# Patient Record
Sex: Female | Born: 1980 | Race: Black or African American | Hispanic: No | Smoking: Current every day smoker
Health system: Southern US, Community
[De-identification: ages and names within clinical notes are randomized; demographics above are authoritative.]

## PROBLEM LIST (undated history)

## (undated) DIAGNOSIS — G5701 Lesion of sciatic nerve, right lower limb: Secondary | ICD-10-CM

## (undated) DIAGNOSIS — J45909 Unspecified asthma, uncomplicated: Secondary | ICD-10-CM

## (undated) DIAGNOSIS — T7840XA Allergy, unspecified, initial encounter: Secondary | ICD-10-CM

## (undated) DIAGNOSIS — G479 Sleep disorder, unspecified: Secondary | ICD-10-CM

## (undated) DIAGNOSIS — F329 Major depressive disorder, single episode, unspecified: Secondary | ICD-10-CM

## (undated) DIAGNOSIS — F32A Depression, unspecified: Secondary | ICD-10-CM

## (undated) DIAGNOSIS — C539 Malignant neoplasm of cervix uteri, unspecified: Secondary | ICD-10-CM

## (undated) HISTORY — DX: Major depressive disorder, single episode, unspecified: F32.9

## (undated) HISTORY — DX: Allergy, unspecified, initial encounter: T78.40XA

## (undated) HISTORY — DX: Lesion of sciatic nerve, right lower limb: G57.01

## (undated) HISTORY — DX: Sleep disorder, unspecified: G47.9

## (undated) HISTORY — PX: NO PAST SURGERIES: SHX2092

## (undated) HISTORY — DX: Malignant neoplasm of cervix uteri, unspecified: C53.9

## (undated) HISTORY — DX: Unspecified asthma, uncomplicated: J45.909

## (undated) HISTORY — DX: Depression, unspecified: F32.A

---

## 2003-07-18 DIAGNOSIS — C539 Malignant neoplasm of cervix uteri, unspecified: Secondary | ICD-10-CM

## 2003-07-18 HISTORY — DX: Malignant neoplasm of cervix uteri, unspecified: C53.9

## 2017-04-26 ENCOUNTER — Encounter: Payer: Self-pay | Admitting: Family Medicine

## 2017-04-26 ENCOUNTER — Ambulatory Visit (INDEPENDENT_AMBULATORY_CARE_PROVIDER_SITE_OTHER): Payer: Medicaid Other | Admitting: Family Medicine

## 2017-04-26 VITALS — BP 127/89 | HR 81 | Temp 97.6°F | Ht 65.5 in | Wt 194.2 lb

## 2017-04-26 DIAGNOSIS — M5431 Sciatica, right side: Secondary | ICD-10-CM | POA: Diagnosis not present

## 2017-04-26 DIAGNOSIS — R2689 Other abnormalities of gait and mobility: Secondary | ICD-10-CM | POA: Diagnosis not present

## 2017-04-26 DIAGNOSIS — E669 Obesity, unspecified: Secondary | ICD-10-CM | POA: Diagnosis not present

## 2017-04-26 DIAGNOSIS — Z820 Family history of epilepsy and other diseases of the nervous system: Secondary | ICD-10-CM | POA: Diagnosis not present

## 2017-04-26 NOTE — Progress Notes (Signed)
   HPI  Patient presents today here to establish care.  Patient has moved from [place] about one year ago, since that time she has not seen a physician.  Patient states that she's concerned that she is developing multiple sclerosis. Her mother died at the age of 60 with breast cancer, she was diagnosed with multiple sclerosis around 36 years old. Patient states that she has multiple symptoms consistent with multiple sclerosis including driving her feet, bilateral leg and normal pain, difficulty with balance at times, blurry vision, and occasional double vision.  Patient also complains of right-sided low back pain radiating down to the right popliteal fossa. She states this has been going on for more than 10 years. She denies history of trauma or injury to the back. She does not even try Tylenol for the pain.  PMH: Asthma, allergies, cervical cancer 2005, depression Surgical history: None Family history Alzheimer's in [facility], breast and brain cancer in mother, mother also with migraines, hyperlipidemia, depression, dementia. Father with hyperlipidemia and hypertension. Social history: Current smoker- denies drug and alcohol use ROS: Per HPI  Objective: BP 127/89   Pulse 81   Temp 97.6 F (36.4 C) (Oral)   Ht 5' 5.5" (1.664 m)   Wt 194 lb 3.2 oz (88.1 kg)   LMP 04/17/2017 (Approximate)   BMI 31.83 kg/m  Gen: NAD, alert, cooperative with exam Unusual presentation, it is currently raining very heavily outside and patient and her sister present soaking wet, they exaplined they have walked from home. Poor grooming HEENT: NCAT CV: RRR, good S1/S2, no murmur Resp: CTABL, no wheezes, non-labored Ext: No edema, warm Neuro: Alert and oriented, No gross deficits  Assessment and plan:  # Obesity Discussed with patient, discussed therapeutic lifestyle changes Labs, nonfasting  # Sciatica of the right side Plain film today, patient states that she'll return for this. Discussed  Tylenol for pain  # Family history of MS, balance problems Patient has multiple symptoms she states her consistent with multiple sclerosis, her exam is reassuring. Refer to neurology for thorough evaluation   Orders Placed This Encounter  Procedures  . DG Lumbar Spine 2-3 Views    Standing Status:   Future    Standing Expiration Date:   06/26/2018    Order Specific Question:   Reason for Exam (SYMPTOM  OR DIAGNOSIS REQUIRED)    Answer:   low back with scaitica R side    Order Specific Question:   Is the patient pregnant?    Answer:   No    Order Specific Question:   Preferred imaging location?    Answer:   Internal  . CMP14+EGFR  . CBC with Differential/Platelet  . Lipid panel  . TSH  . Ambulatory referral to Neurology    Referral Priority:   Routine    Referral Type:   Consultation    Referral Reason:   Specialty Services Required    Requested Specialty:   Neurology    Number of Visits Requested:   [place], MD [facility] 04/26/2017, 1:50 PM

## 2017-04-26 NOTE — Patient Instructions (Signed)
Great to meet you!  Come back in 3-4 months unless you need [place] sooner.   We will get the ball rolling on a neurology referral

## 2017-04-27 ENCOUNTER — Other Ambulatory Visit: Payer: Self-pay

## 2017-04-27 DIAGNOSIS — R7989 Other specified abnormal findings of blood chemistry: Secondary | ICD-10-CM

## 2017-04-27 LAB — CMP14+EGFR
A/G RATIO: 2 (ref 1.2–2.2)
ALK PHOS: 59 IU/L (ref 39–117)
ALT: 21 IU/L (ref 0–32)
AST: 23 IU/L (ref 0–40)
Albumin: 4.3 g/dL (ref 3.5–5.5)
BUN/Creatinine Ratio: 7 — ABNORMAL LOW (ref 9–23)
BUN: 7 mg/dL (ref 6–20)
Bilirubin Total: 0.3 mg/dL (ref 0.0–1.2)
CHLORIDE: 104 mmol/L (ref 96–106)
CO2: 24 mmol/L (ref 20–29)
Calcium: 9.5 mg/dL (ref 8.7–10.2)
Creatinine, Ser: 1.07 mg/dL — ABNORMAL HIGH (ref 0.57–1.00)
GFR calc Af Amer: 78 mL/min/{1.73_m2} (ref >59)
GFR calc non Af Amer: 67 mL/min/{1.73_m2} (ref >59)
GLOBULIN, TOTAL: 2.1 g/dL (ref 1.5–4.5)
Glucose: 81 mg/dL (ref 65–99)
POTASSIUM: 3.8 mmol/L (ref 3.5–5.2)
SODIUM: 142 mmol/L (ref 134–144)
Total Protein: 6.4 g/dL (ref 6.0–8.5)

## 2017-04-27 LAB — CBC WITH DIFFERENTIAL/PLATELET
BASOS ABS: 0 10*3/uL (ref 0.0–0.2)
BASOS: 1 %
EOS (ABSOLUTE): 0.1 10*3/uL (ref 0.0–0.4)
Eos: 1 %
Hematocrit: 37.7 % (ref 34.0–46.6)
Hemoglobin: 12.7 g/dL (ref 11.1–15.9)
IMMATURE GRANULOCYTES: 0 %
Immature Grans (Abs): 0 10*3/uL (ref 0.0–0.1)
Lymphocytes Absolute: 2.4 10*3/uL (ref 0.7–3.1)
Lymphs: 48 %
MCH: 26.9 pg (ref 26.6–33.0)
MCHC: 33.7 g/dL (ref 31.5–35.7)
MCV: 80 fL (ref 79–97)
MONOS ABS: 0.4 10*3/uL (ref 0.1–0.9)
Monocytes: 8 %
NEUTROS PCT: 42 %
Neutrophils Absolute: 2.2 10*3/uL (ref 1.4–7.0)
PLATELETS: 262 10*3/uL (ref 150–379)
RBC: 4.72 x10E6/uL (ref 3.77–5.28)
RDW: 14.8 % (ref 12.3–15.4)
WBC: 5.1 10*3/uL (ref 3.4–10.8)

## 2017-04-27 LAB — LIPID PANEL
Chol/HDL Ratio: 4.4 ratio (ref 0.0–4.4)
Cholesterol, Total: 171 mg/dL (ref 100–199)
HDL: 39 mg/dL — AB (ref >39)
LDL Calculated: 120 mg/dL — ABNORMAL HIGH (ref 0–99)
Triglycerides: 58 mg/dL (ref 0–149)
VLDL Cholesterol Cal: 12 mg/dL (ref 5–40)

## 2017-04-27 LAB — TSH: TSH: 0.411 u[IU]/mL — AB (ref 0.450–4.500)

## 2017-05-08 ENCOUNTER — Other Ambulatory Visit (INDEPENDENT_AMBULATORY_CARE_PROVIDER_SITE_OTHER): Payer: Medicaid Other

## 2017-05-08 ENCOUNTER — Other Ambulatory Visit: Payer: Medicaid Other

## 2017-05-08 ENCOUNTER — Ambulatory Visit (INDEPENDENT_AMBULATORY_CARE_PROVIDER_SITE_OTHER): Payer: Medicaid Other

## 2017-05-08 DIAGNOSIS — R7989 Other specified abnormal findings of blood chemistry: Secondary | ICD-10-CM

## 2017-05-08 DIAGNOSIS — M5431 Sciatica, right side: Secondary | ICD-10-CM | POA: Diagnosis not present

## 2017-05-09 LAB — THYROID PANEL WITH TSH
Free Thyroxine Index: 2 (ref 1.2–4.9)
T3 UPTAKE RATIO: 26 % (ref 24–39)
T4 TOTAL: 7.7 ug/dL (ref 4.5–12.0)
TSH: 0.961 u[IU]/mL (ref 0.450–4.500)

## 2017-07-04 ENCOUNTER — Ambulatory Visit (INDEPENDENT_AMBULATORY_CARE_PROVIDER_SITE_OTHER): Payer: Medicaid Other | Admitting: Neurology

## 2017-07-04 ENCOUNTER — Encounter: Payer: Self-pay | Admitting: Neurology

## 2017-07-04 VITALS — BP 106/69 | HR 81 | Ht 66.0 in | Wt 197.4 lb

## 2017-07-04 DIAGNOSIS — R202 Paresthesia of skin: Secondary | ICD-10-CM

## 2017-07-04 DIAGNOSIS — G5603 Carpal tunnel syndrome, bilateral upper limbs: Secondary | ICD-10-CM

## 2017-07-04 DIAGNOSIS — G35 Multiple sclerosis: Secondary | ICD-10-CM

## 2017-07-04 DIAGNOSIS — R29898 Other symptoms and signs involving the musculoskeletal system: Secondary | ICD-10-CM

## 2017-07-04 DIAGNOSIS — R531 Weakness: Secondary | ICD-10-CM | POA: Diagnosis not present

## 2017-07-04 DIAGNOSIS — H532 Diplopia: Secondary | ICD-10-CM | POA: Diagnosis not present

## 2017-07-04 DIAGNOSIS — G35D Multiple sclerosis, unspecified: Secondary | ICD-10-CM

## 2017-07-04 DIAGNOSIS — Z820 Family history of epilepsy and other diseases of the nervous system: Secondary | ICD-10-CM | POA: Diagnosis not present

## 2017-07-04 DIAGNOSIS — M5430 Sciatica, unspecified side: Secondary | ICD-10-CM | POA: Diagnosis not present

## 2017-07-04 MED ORDER — GABAPENTIN 300 MG PO CAPS: 300.0000 mg | ORAL_CAPSULE | Freq: Three times a day (TID) | ORAL | 11 refills | Status: DC

## 2017-07-04 NOTE — Progress Notes (Addendum)
[id number]    Provider:  Dr Jaynee Eagles Referring Provider: Timmothy Euler, MD Primary Care Physician:  Timmothy Euler, MD  CC:  Multiple neurologic symptoms  HPI:  Terri Flowers is a 36 y.o. female here as a referral from Dr. Wendi Snipes for MS. She is here with niece. She reports balance problem; she is here with her niece. her mother was diagnosed 15 years ago with MS and passed away 3 years ago. She states that she has been having more problems such as dragging of her feet, body aches every day especially in the morning, tingling in her fingers, numbness in hands & arms, a section of her L leg that has been numb a couple of years. She wear glasses but notice increased blurred vision in her R peripheral field of vision. She has a h/o migraines and sciatic disorder.  She says she has a diagnosis of sciatica, she is more interested in discussing other symptoms. She has body aches especially in the morning, she walks around like a 36 year old women, gets better in a few minutes to warm up. She has tingling in the hands and fingers and numbness in her hands arms arms even just by holding her phone. She says symptoms are progressively worse, started 4-5 years ago. She has numbness in the left side of her upper thigh.   Reviewed notes, labs and imaging from outside physicians, which showed:  Reviewed primary care notes, patient was seen complaining of multiple neurologic symptoms and worried about multiple sclerosis, her mother died at the age of 5 with breast cancer and was diagnosed with multiple sclerosis around the age of 75.  Patient reported multiple symptoms consistent with multiple sclerosis including dragging her feet, pain, difficulty with balance at times, blurry vision and occasional double vision.  Also diagnosed with sciatica in the past.  TSH abnormal  .411 and she has been asked to return for a thyroid panel with TSH, CBC and CMP unremarkable  Review of  Systems: Patient complains of symptoms per HPI as well as the following symptoms: Numbness, tingling, fatigue, family history of multiple sclerosis. Pertinent negatives and positives per HPI. All others negative.   Social History   Socioeconomic History  . Marital status: Divorced    Spouse name: Not on file  . Number of children: 1  . Years of education: Not on file  . Highest education level: Some college, no degree  Social Needs  . Financial resource strain: Not on file  . Food insecurity - worry: Not on file  . Food insecurity - inability: Not on file  . Transportation needs - medical: Not on file  . Transportation needs - non-medical: Not on file  Occupational History  . Occupation: unemployed  Tobacco Use  . Smoking status: Former Smoker    Packs/day: 1.00    Types: Cigarettes    Last attempt to quit: 04/2017    Years since quitting: 0.2  . Smokeless tobacco: Never Used  Substance and Sexual Activity  . Alcohol use: Yes    Comment: "every now and then"  . Drug use: No  . Sexual activity: Not on file  Other Topics Concern  . Not on file  Social History Narrative   Lives at home with daughter and boyfriend   Drinks about a 2 liter's worth of caffeine daily   Right handed    Family History  Problem Relation Age of Onset  . Cancer Mother  breast and brain  . Depression Mother   . Hyperlipidemia Mother   . Hypertension Mother   . Dementia Mother   . Migraines Mother   . Multiple sclerosis Mother   . Hypertension Father   . Hyperlipidemia Father   . Depression Sister   . Alzheimer's disease Maternal Grandmother     Past Medical History:  Diagnosis Date  . Allergy   . Asthma   . Cervical cancer ([facility]) 2005  . Depression   . Disorder of right sciatic nerve   . Sleeping difficulty     Past Surgical History:  Procedure Laterality Date  . NO PAST SURGERIES      Current Outpatient Medications  Medication Sig Dispense Refill  . gabapentin  (NEURONTIN) 300 MG capsule Take 1 capsule (300 mg total) by mouth 3 (three) times daily. 90 capsule 11   No current facility-administered medications for this visit.     Allergies as of 07/04/2017  . (Not on File)    Vitals: BP 106/69 (BP Location: Right Arm, Patient Position: Sitting)   Pulse 81   Ht 5\' 6"  (1.676 m)   Wt 197 lb 6.4 oz (89.5 kg)   LMP 07/04/2017 (Exact Date)   BMI 31.86 kg/m  Last Weight:  Wt Readings from Last 1 Encounters:  07/04/17 197 lb 6.4 oz (89.5 kg)   Last Height:   Ht Readings from Last 1 Encounters:  07/04/17 5\' 6"  (1.676 m)    Physical exam: Exam: Gen: NAD, conversant, well nourised, obese, well groomed                     CV: RRR, no MRG. No Carotid Bruits. No peripheral edema, warm, nontender Eyes: Conjunctivae clear without exudates or hemorrhage  Neuro: Detailed Neurologic Exam  Speech:    Speech is normal; fluent and spontaneous with normal comprehension.  Cognition:    The patient is oriented to person, place, and time;     recent and remote memory intact;     language fluent;     normal attention, concentration,     fund of knowledge Cranial Nerves:    The pupils are equal, round, and reactive to light. The fundi are normal and spontaneous venous pulsations are present. Visual fields are full to finger confrontation. Extraocular movements are intact. Trigeminal sensation is intact and the muscles of mastication are normal. The face is symmetric. The palate elevates in the midline. Hearing intact. Voice is normal. Shoulder shrug is normal. The tongue has normal motion without fasciculations.   Coordination:    Normal finger to nose and heel to shin. Normal rapid alternating movements.   Gait:    Heel-toe and tandem gait are normal.   Motor Observation:    No asymmetry, no atrophy, and no involuntary movements noted. Tone:    Normal muscle tone.    Posture:    Posture is normal. normal erect    Strength: Right hip flexion  and biceps fem mild weakness otherwise strength is V/V in the upper and lower limbs.      Sensation: intact to LT     Reflex Exam:  DTR's:    Deep tendon reflexes in the upper and lower extremities are normal bilaterally.   Toes:    The toes are downgoing bilaterally.   Clonus:    Clonus is absent.      Assessment/Plan: Patient with multiple neurologic symptoms including weakness, paresthesias and numbness, blurred vision in the right eye, diplopia.  Patient's mother is deceased and had multiple sclerosis, patient is concerned about this diagnosis for herself.  Exam does show some right leg weakness.  Will perform MRI of the brain and cervical spine to evaluate for multiple sclerosis, also EMG nerve conduction study of the bilateral uppers in the right leg to evaluate for carpal tunnel syndrome or radiculopathy.  Gabapentin as needed for sciatica as well as left Meralgia Paresthetica.  Orders Placed This Encounter  Procedures  . MR BRAIN W WO CONTRAST  . MR CERVICAL SPINE W WO CONTRAST  . NCV with EMG(electromyography)    Sarina Ill, MD  [facility] [address] [place], [place] [address]  Phone [phone] Fax [phone]

## 2017-07-04 NOTE — Patient Instructions (Signed)
Mri brain and cervical spine emg/ncs

## 2017-07-23 ENCOUNTER — Inpatient Hospital Stay: Admission: RE | Admit: 2017-07-23 | Payer: Self-pay | Source: Ambulatory Visit

## 2017-07-23 ENCOUNTER — Other Ambulatory Visit: Payer: Self-pay

## 2017-07-26 ENCOUNTER — Encounter: Payer: Medicaid Other | Admitting: Neurology

## 2017-08-06 ENCOUNTER — Encounter: Payer: Self-pay | Admitting: Family Medicine

## 2017-08-07 ENCOUNTER — Ambulatory Visit: Payer: Medicaid Other | Admitting: Family Medicine

## 2017-08-07 ENCOUNTER — Encounter: Payer: Self-pay | Admitting: Family Medicine

## 2017-08-07 VITALS — BP 113/79 | HR 96 | Temp 98.8°F | Ht 66.0 in | Wt 204.8 lb

## 2017-08-07 DIAGNOSIS — H9201 Otalgia, right ear: Secondary | ICD-10-CM

## 2017-08-07 MED ORDER — FLUCONAZOLE 150 MG PO TABS: ORAL_TABLET | ORAL | 0 refills | Status: DC

## 2017-08-07 MED ORDER — DOXYCYCLINE HYCLATE 100 MG PO TABS: 100.0000 mg | ORAL_TABLET | Freq: Two times a day (BID) | ORAL | 0 refills | Status: DC

## 2017-08-07 NOTE — Patient Instructions (Signed)
Great to see you!  If you develop worsening symptoms, fever, chills, or difficulty tolerating foods or fluids please call, come back to the clinic, or seek emergency medical help.

## 2017-08-07 NOTE — Progress Notes (Signed)
   HPI  Patient presents today here with ear pain.  Patient states she has had ear pain on the right side for about 3 days.  She states that it has also been radiating to her right jaw, her right head, and her right lower face.  She states that her hearing has also been a little bit muffled.  She has had severe pain. She denies fever, chills, sweats. She is tolerating food and fluids like usual.   PMH: Smoking status noted ROS: Per HPI  Objective: BP 113/79   Pulse 96   Temp 98.8 F (37.1 C) (Oral)   Ht 5\' 6"  (1.676 m)   Wt 204 lb 12.8 oz (92.9 kg)   BMI 33.06 kg/m  Gen: NAD, alert, cooperative with exam HEENT: NCAT,'TM's without erythema or loss of landmarks but she does have a right-sided effusion that is moderate, tenderness also of the right temple as well as the right mastoid CV: RRR, good S1/S2, no murmur Resp: CTABL, no wheezes, non-labored Ext: No edema, warm Neuro: Alert and oriented, No gross deficits  Assessment and plan:  #Otalgia With diffuse pain of the right side of her face With muffled hearing I am convinced that she could have developing ear infection Doxycycline Provided red flags for return Diflucan for possible yeast infection after antibiotics     Meds ordered this encounter  Medications  . doxycycline (VIBRA-TABS) 100 MG tablet    Sig: Take 1 tablet (100 mg total) by mouth 2 (two) times daily. 1 po bid    Dispense:  20 tablet    Refill:  0  . fluconazole (DIFLUCAN) 150 MG tablet    Sig: Take one pill and repeat in 3 days    Dispense:  2 tablet    Refill:  0    Laroy Apple, MD [facility] 08/07/2017, 2:48 PM

## 2017-10-12 ENCOUNTER — Ambulatory Visit (INDEPENDENT_AMBULATORY_CARE_PROVIDER_SITE_OTHER): Payer: Medicaid Other

## 2017-10-12 ENCOUNTER — Ambulatory Visit (INDEPENDENT_AMBULATORY_CARE_PROVIDER_SITE_OTHER): Payer: Medicaid Other | Admitting: Family Medicine

## 2017-10-12 ENCOUNTER — Encounter: Payer: Self-pay | Admitting: Family Medicine

## 2017-10-12 VITALS — BP 128/84 | HR 101 | Temp 98.6°F | Ht 66.0 in | Wt 204.2 lb

## 2017-10-12 DIAGNOSIS — M79671 Pain in right foot: Secondary | ICD-10-CM | POA: Diagnosis not present

## 2017-10-12 MED ORDER — CEFDINIR 300 MG PO CAPS: 300.0000 mg | ORAL_CAPSULE | Freq: Two times a day (BID) | ORAL | 0 refills | Status: DC

## 2017-10-12 MED ORDER — HYDROCODONE-ACETAMINOPHEN 5-325 MG PO TABS: 1.0000 | ORAL_TABLET | Freq: Four times a day (QID) | ORAL | 0 refills | Status: DC | PRN

## 2017-10-12 NOTE — Progress Notes (Signed)
   HPI  Patient presents today here with right foot pain.  Patient explains she has had swelling and pain of the right foot on the plantar surface for about 2 days.  This began without any injury or obvious cause.  She states it is gotten worse over the last 2 days and is at currently a 9 out of 10 pain.  Patient states that she has not had any recent injury.  She has had a laceration for which has healed and is in the past.  PMH: Smoking status noted ROS: Per HPI  Objective: BP 128/84   Pulse (!) 101   Temp 98.6 F (37 C) (Oral)   Ht 5\' 6"  (1.676 m)   Wt 204 lb 3.2 oz (92.6 kg)   BMI 32.96 kg/m  Gen: NAD, alert, cooperative with exam HEENT: NCAT, CV: RRR, good S1/S2, no murmur Resp: CTABL, no wheezes, non-labored Ext: No edema, warm Neuro: Alert and oriented, No gross deficits MSK:   Tenderness of the plantar surface of the right foot, no warmth. Calf circumference is 44.5 cm bilaterally  Skin- plantar R foot with tense skin, mild erythema and TTP that is reminiscent of cellulitis  Limping to the R  Assessment and plan:  #Right foot pain Patient with usual right foot pain She does have lots of tenderness consistent with cellulitis, however a very unusual presentation of cellulitis. Covering with Omnicef Plain film appears to be negative for bony abnormality Also given a small amount of pain medication for what appears to be severe pain  Orders Placed This Encounter  Procedures  . DG Foot Complete Right    Standing Status:   Future    Number of Occurrences:   1    Standing Expiration Date:   12/13/2018    Order Specific Question:   Reason for Exam (SYMPTOM  OR DIAGNOSIS REQUIRED)    Answer:   foot pain    Order Specific Question:   Is patient pregnant?    Answer:   No    Order Specific Question:   Preferred imaging location?    Answer:   Internal    Order Specific Question:   Radiology Contrast Protocol - do NOT remove file path    Answer:    [email or web address]    Meds ordered this encounter  Medications  . cefdinir (OMNICEF) 300 MG capsule    Sig: Take 1 capsule (300 mg total) by mouth 2 (two) times daily. 1 po BID    Dispense:  20 capsule    Refill:  0  . HYDROcodone-acetaminophen (NORCO) 5-325 MG tablet    Sig: Take 1 tablet by mouth every 6 (six) hours as needed for moderate pain.    Dispense:  10 tablet    Refill:  0    Laroy Apple, MD [facility] 10/12/2017, 3:45 PM

## 2017-10-15 ENCOUNTER — Encounter: Payer: Self-pay | Admitting: Family

## 2017-10-15 ENCOUNTER — Ambulatory Visit: Payer: Medicaid Other | Admitting: Family

## 2017-10-15 VITALS — BP 105/72 | HR 89 | Temp 97.6°F | Ht 66.0 in | Wt 208.2 lb

## 2017-10-15 DIAGNOSIS — M722 Plantar fascial fibromatosis: Secondary | ICD-10-CM

## 2017-10-15 MED ORDER — PREDNISONE 10 MG (21) PO TBPK: ORAL_TABLET | ORAL | 0 refills | Status: DC

## 2017-10-15 MED ORDER — NAPROXEN 500 MG PO TABS: 500.0000 mg | ORAL_TABLET | Freq: Two times a day (BID) | ORAL | 1 refills | Status: DC

## 2017-10-15 NOTE — Progress Notes (Signed)
   Subjective:    Patient ID: Terri Flowers, female    DOB: [birth year], 37 y.o.   MRN: [id number]  HPI Pt presents to the office today with right foot pain that started on 10/09/17. Pt was seen in the office on 10/12/17 and treated for cellulitis of right foot.  Pt states the antibiotic helped with the swelling, but states she continues to have aching pain of 7 out 10.   PT had negative x-ray for fracture.   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    Review of Systems  All other systems reviewed and are negative.      Objective:   Physical Exam  Constitutional: She is oriented to person, place, and time. She appears well-developed and well-nourished. No distress.  HENT:  Head: Normocephalic.  Eyes: Pupils are equal, round, and reactive to light.  Neck: Normal range of motion. Neck supple. No thyromegaly present.  Cardiovascular: Normal rate, regular rhythm, normal heart sounds and intact distal pulses.  No murmur heard. Pulmonary/Chest: Effort normal and breath sounds normal. No respiratory distress. She has no wheezes.  Abdominal: Soft. Bowel sounds are normal. She exhibits no distension. There is no tenderness.  Musculoskeletal: Normal range of motion. She exhibits tenderness (in right heel with palpation ). She exhibits no edema.  Neurological: She is alert and oriented to person, place, and time.  Skin: Skin is warm and dry.  Psychiatric: She has a normal mood and affect. Her behavior is normal. Judgment and thought content normal.  Vitals reviewed.     BP 105/72   Pulse 89   Temp 97.6 F (36.4 C) (Oral)   Ht 5\' 6"  (1.676 m)   Wt 208 lb 3.2 oz (94.4 kg)   BMI 33.60 kg/m      Assessment & Plan:  1. Plantar fasciitis Rest Ice ROM exercises Good shoe support- Pt wearing flip flops today and walked her from her home Frozen water bottle heel to toe exercise 3-4 times a day RTO prn or if pain does  not improve - predniSONE (STERAPRED UNI-PAK 21 TAB) 10 MG (21) TBPK tablet; Use as directed  Dispense: 21 tablet; Refill: 0 - naproxen (NAPROSYN) 500 MG tablet; Take 1 tablet (500 mg total) by mouth 2 (two) times daily with a meal.  Dispense: 60 tablet; Refill: [place], FNP

## 2017-10-15 NOTE — Patient Instructions (Signed)

## 2018-04-25 DIAGNOSIS — R112 Nausea with vomiting, unspecified: Secondary | ICD-10-CM | POA: Diagnosis not present

## 2018-04-25 DIAGNOSIS — A059 Bacterial foodborne intoxication, unspecified: Secondary | ICD-10-CM | POA: Diagnosis not present

## 2018-08-16 ENCOUNTER — Ambulatory Visit: Payer: Medicaid Other | Admitting: Family

## 2018-08-16 ENCOUNTER — Encounter: Payer: Self-pay | Admitting: Family

## 2018-08-16 VITALS — BP 105/72 | HR 77 | Temp 97.0°F | Ht 66.0 in | Wt 205.8 lb

## 2018-08-16 DIAGNOSIS — Z820 Family history of epilepsy and other diseases of the nervous system: Secondary | ICD-10-CM

## 2018-08-16 DIAGNOSIS — Z0000 Encounter for general adult medical examination without abnormal findings: Secondary | ICD-10-CM

## 2018-08-16 DIAGNOSIS — R531 Weakness: Secondary | ICD-10-CM

## 2018-08-16 DIAGNOSIS — M5430 Sciatica, unspecified side: Secondary | ICD-10-CM | POA: Diagnosis not present

## 2018-08-16 DIAGNOSIS — Z0001 Encounter for general adult medical examination with abnormal findings: Secondary | ICD-10-CM | POA: Diagnosis not present

## 2018-08-16 DIAGNOSIS — E669 Obesity, unspecified: Secondary | ICD-10-CM

## 2018-08-16 MED ORDER — GABAPENTIN 300 MG PO CAPS: 300.0000 mg | ORAL_CAPSULE | Freq: Three times a day (TID) | ORAL | 11 refills | Status: DC

## 2018-08-16 NOTE — Progress Notes (Signed)
Subjective:    Patient ID: Terri Flowers, female    DOB: 1980/09/27, 38 y.o.   MRN: [id number]  Chief Complaint  Patient presents with  . Medical Management of Chronic Issues    refills   .  HPI PT presents to the office today for CPE without pap. PT states she has recently  [employer]. She states she has intermittent stabbing sciatica pain of 8 out 10 in her left lower leg and foot. She takes gabapentin 300 mg TID that helps.   She states she has seen a Neurologists in the past for this pain for carpal tunnel syndrome, weakness, and numbness. She was in the process of getting " MRI of the brain and cervical spine to evaluate for multiple sclerosis, also EMG nerve conduction study of the bilateral uppers in the right leg to evaluate for carpal tunnel syndrome or radiculopathy". However, she states she lost her insurance and never followed up. She is requesting another referral for this.   Pt's mother had MS.    Review of Systems  Constitutional: Positive for fatigue.  Eyes: Visual disturbance: blurred vision at times.  Neurological: Positive for weakness. Negative for headaches.  All other systems reviewed and are negative.      Objective:   Physical Exam Vitals signs reviewed.  Constitutional:      General: She is not in acute distress.    Appearance: She is well-developed.  HENT:     Head: Normocephalic and atraumatic.     Right Ear: Tympanic membrane normal.     Left Ear: Tympanic membrane normal.  Eyes:     Pupils: Pupils are equal, round, and reactive to light.  Neck:     Musculoskeletal: Normal range of motion and neck supple.     Thyroid: No thyromegaly.  Cardiovascular:     Rate and Rhythm: Normal rate and regular rhythm.     Heart sounds: Normal heart sounds. No murmur.  Pulmonary:     Effort: Pulmonary effort is normal. No respiratory distress.     Breath sounds: Normal breath sounds. No wheezing.  Abdominal:     General: Bowel sounds are normal. There  is no distension.     Palpations: Abdomen is soft.     Tenderness: There is no abdominal tenderness.  Musculoskeletal: Normal range of motion.        General: No tenderness.  Skin:    General: Skin is warm and dry.  Neurological:     Mental Status: She is alert and oriented to person, place, and time.     Cranial Nerves: No cranial nerve deficit.     Motor: No weakness.     Coordination: Coordination normal.     Gait: Gait normal.     Deep Tendon Reflexes: Reflexes are normal and symmetric.  Psychiatric:        Behavior: Behavior normal.        Thought Content: Thought content normal.        Judgment: Judgment normal.       BP 105/72   Pulse 77   Temp (!) 97 F (36.1 C) (Oral)   Ht 5' 6" (1.676 m)   Wt 205 lb 12.8 oz (93.4 kg)   LMP 08/05/2018   BMI 33.22 kg/m      Assessment & Plan:  Terri Flowers comes in today with chief complaint of Annual Exam   Diagnosis and orders addressed:  1. Annual physical exam - CMP14+EGFR - CBC with Differential/Platelet - Lipid  panel - TSH  2. Weakness - CMP14+EGFR - CBC with Differential/Platelet  3. Family history of MS (multiple sclerosis) - CMP14+EGFR - CBC with Differential/Platelet  4. Sciatica, unspecified laterality - CMP14+EGFR - CBC with Differential/Platelet - gabapentin (NEURONTIN) 300 MG capsule; Take 1 capsule (300 mg total) by mouth 3 (three) times daily.  Dispense: 90 capsule; Refill: 11  5. Obesity (BMI 30-39.9) - CMP14+EGFR - CBC with Differential/Platelet   Labs pending Health Maintenance reviewed Diet and exercise encouraged  Follow up plan: 6 months    Christy Hawks, FNP  

## 2018-08-16 NOTE — Patient Instructions (Signed)

## 2018-08-17 LAB — CBC WITH DIFFERENTIAL/PLATELET
Basophils Absolute: 0.1 10*3/uL (ref 0.0–0.2)
Basos: 1 %
EOS (ABSOLUTE): 0.1 10*3/uL (ref 0.0–0.4)
Eos: 2 %
Hematocrit: 42.5 % (ref 34.0–46.6)
Hemoglobin: 13.6 g/dL (ref 11.1–15.9)
Immature Grans (Abs): 0 10*3/uL (ref 0.0–0.1)
Immature Granulocytes: 0 %
LYMPHS ABS: 2.7 10*3/uL (ref 0.7–3.1)
Lymphs: 52 %
MCH: 26.8 pg (ref 26.6–33.0)
MCHC: 32 g/dL (ref 31.5–35.7)
MCV: 84 fL (ref 79–97)
Monocytes Absolute: 0.5 10*3/uL (ref 0.1–0.9)
Monocytes: 9 %
Neutrophils Absolute: 1.9 10*3/uL (ref 1.4–7.0)
Neutrophils: 36 %
PLATELETS: 266 10*3/uL (ref 150–450)
RBC: 5.07 x10E6/uL (ref 3.77–5.28)
RDW: 13.4 % (ref 11.7–15.4)
WBC: 5.3 10*3/uL (ref 3.4–10.8)

## 2018-08-17 LAB — CMP14+EGFR
ALBUMIN: 4.4 g/dL (ref 3.8–4.8)
ALT: 24 IU/L (ref 0–32)
AST: 23 IU/L (ref 0–40)
Albumin/Globulin Ratio: 2.2 (ref 1.2–2.2)
Alkaline Phosphatase: 63 IU/L (ref 39–117)
BUN / CREAT RATIO: 7 — AB (ref 9–23)
BUN: 6 mg/dL (ref 6–20)
Bilirubin Total: <0.2 mg/dL (ref 0.0–1.2)
CALCIUM: 9.4 mg/dL (ref 8.7–10.2)
CO2: 23 mmol/L (ref 20–29)
CREATININE: 0.86 mg/dL (ref 0.57–1.00)
Chloride: 104 mmol/L (ref 96–106)
GFR calc Af Amer: 101 mL/min/{1.73_m2} (ref >59)
GFR, EST NON AFRICAN AMERICAN: 87 mL/min/{1.73_m2} (ref >59)
GLOBULIN, TOTAL: 2 g/dL (ref 1.5–4.5)
GLUCOSE: 82 mg/dL (ref 65–99)
Potassium: 4.2 mmol/L (ref 3.5–5.2)
SODIUM: 140 mmol/L (ref 134–144)
Total Protein: 6.4 g/dL (ref 6.0–8.5)

## 2018-08-17 LAB — TSH: TSH: 1.44 u[IU]/mL (ref 0.450–4.500)

## 2018-08-17 LAB — LIPID PANEL
Chol/HDL Ratio: 4.5 ratio — ABNORMAL HIGH (ref 0.0–4.4)
Cholesterol, Total: 170 mg/dL (ref 100–199)
HDL: 38 mg/dL — ABNORMAL LOW (ref >39)
LDL Calculated: 117 mg/dL — ABNORMAL HIGH (ref 0–99)
Triglycerides: 75 mg/dL (ref 0–149)
VLDL Cholesterol Cal: 15 mg/dL (ref 5–40)

## 2019-02-17 ENCOUNTER — Ambulatory Visit (INDEPENDENT_AMBULATORY_CARE_PROVIDER_SITE_OTHER): Payer: Medicaid Other | Admitting: Family

## 2019-02-17 ENCOUNTER — Encounter: Payer: Self-pay | Admitting: Family

## 2019-02-17 DIAGNOSIS — L0291 Cutaneous abscess, unspecified: Secondary | ICD-10-CM | POA: Diagnosis not present

## 2019-02-17 DIAGNOSIS — M5432 Sciatica, left side: Secondary | ICD-10-CM

## 2019-02-17 DIAGNOSIS — M5430 Sciatica, unspecified side: Secondary | ICD-10-CM | POA: Diagnosis not present

## 2019-02-17 MED ORDER — SULFAMETHOXAZOLE-TRIMETHOPRIM 800-160 MG PO TABS: 1.0000 | ORAL_TABLET | Freq: Two times a day (BID) | ORAL | 0 refills | Status: DC

## 2019-02-17 MED ORDER — GABAPENTIN 300 MG PO CAPS: 300.0000 mg | ORAL_CAPSULE | Freq: Three times a day (TID) | ORAL | 11 refills | Status: DC

## 2019-02-17 MED ORDER — FLUCONAZOLE 150 MG PO TABS: 150.0000 mg | ORAL_TABLET | ORAL | 0 refills | Status: DC | PRN

## 2019-02-17 NOTE — Progress Notes (Signed)
Virtual Visit via telephone Note Due to COVID-19 pandemic this visit was conducted virtually. This visit type was conducted due to national recommendations for restrictions regarding the COVID-19 Pandemic (e.g. social distancing, sheltering in place) in an effort to limit this patient's exposure and mitigate transmission in our community. All issues noted in this document were discussed and addressed.  A physical exam was not performed with this format.  I connected with Terri Flowers on 02/17/19 at 8:19 AM by telephone and verified that I am speaking with the correct person using two identifiers. Terri Flowers is currently located at home and no one is currently with her during visit. The provider, Evelina Dun, FNP is located in their office at time of visit.  I discussed the limitations, risks, security and privacy concerns of performing an evaluation and management service by telephone and the availability of in person appointments. I also discussed with the patient that there may be a patient responsible charge related to this service. The patient expressed understanding and agreed to proceed.   History and Present Illness:  Pt calls the office today with complaints of three abscess in her right axilla that she she noticed 3-4 days ago. She states she had one to started draining a yellow white discharge. However, she states one of them is as large as a quarter and is very tender. She reports intermittent aching pain of 6 out 10 when she touches or moves her arm.   Back Pain This is a chronic problem. The current episode started more than 1 year ago. The problem occurs intermittently. The problem has been waxing and waning since onset. The pain is present in the lumbar spine. The quality of the pain is described as aching. The pain radiates to the left thigh. The pain is moderate. The symptoms are aggravated by standing. Associated symptoms include leg pain. Pertinent negatives include no chest  pain, dysuria, tingling or weakness. She has tried bed rest and NSAIDs for the symptoms. The treatment provided mild relief.       Review of Systems  Cardiovascular: Negative for chest pain.  Genitourinary: Negative for dysuria.  Musculoskeletal: Positive for back pain.  Neurological: Negative for tingling and weakness.  All other systems reviewed and are negative.    Observations/Objective: No SOB or distress noted   Assessment and Plan: Terri Flowers comes in today with chief complaint of No chief complaint on file.   Diagnosis and orders addressed:  1. Abscess Warm compresses  Do not pick  Keep clean and dry  - sulfamethoxazole-trimethoprim (BACTRIM DS) 800-160 MG tablet; Take 1 tablet by mouth 2 (two) times daily.  Dispense: 20 tablet; Refill: 0 - fluconazole (DIFLUCAN) 150 MG tablet; Take 1 tablet (150 mg total) by mouth every three (3) days as needed.  Dispense: 3 tablet; Refill: 0  2. Sciatica of left side Rest ROM exercises - gabapentin (NEURONTIN) 300 MG capsule; Take 1 capsule (300 mg total) by mouth 3 (three) times daily.  Dispense: 90 capsule; Refill: 11   Health Maintenance reviewed Diet and exercise encouraged  Follow up plan: Follow up 6 months and schedule as pap     I discussed the assessment and treatment plan with the patient. The patient was provided an opportunity to ask questions and all were answered. The patient agreed with the plan and demonstrated an understanding of the instructions.   The patient was advised to call back or seek an in-person evaluation if the symptoms worsen or if the condition  fails to improve as anticipated.  The above assessment and management plan was discussed with the patient. The patient verbalized understanding of and has agreed to the management plan. Patient is aware to call the clinic if symptoms persist or worsen. Patient is aware when to return to the clinic for a follow-up visit. Patient educated on when it is  appropriate to go to the emergency department.   Time call ended:  8:33 AM  I provided 14 minutes of non-face-to-face time during this encounter.    Evelina Dun, FNP

## 2019-04-07 ENCOUNTER — Telehealth: Payer: Medicaid Other | Admitting: Physician Assistant

## 2019-04-07 DIAGNOSIS — M549 Dorsalgia, unspecified: Secondary | ICD-10-CM

## 2019-04-07 NOTE — Progress Notes (Signed)
Hi Terri Flowers,  I am sorry you are in pain.  I see you were recently seen virtually by Evelina Dun for back pain.  She advised you to call back or seek an in-person evaluation if the symptoms worsen or if the condition fails to improve as anticipated.   Sounds like your back pain is a little worse. I recommend making an appointment with her office or using the information below for an appointment at an urgent care center.   Based on what you shared with me, I feel your condition warrants further evaluation and I recommend that you be seen for a face to face office visit.  NOTE: If you entered your credit card information for this eVisit, you will not be charged. You may see a "hold" on your card for the $35 but that hold will drop off and you will not have a charge processed.  If you are having a true medical emergency please call 911.     For an urgent face to face visit, [facility] has four urgent care centers for your convenience:   . [facility]    [phone]                  Get Driving Directions  [phone] [place], [place] [address] . 10 am to 8 pm Monday-Friday . 12 pm to 8 pm Saturday-Sunday   . [facility] [place]                  Get Driving Directions  [id number] [place], [place], [place] [address] . 8 am to 8 pm Monday-Friday . 9 am to 6 pm Saturday . 11 am to 6 pm Sunday   . [facility] [place]                  Get Driving Directions   [address] [place], [place] [address] . 8 am to 8 pm Monday-Friday . 8 am to 4 pm Saturday-Sunday    . [facility] [place]                    Get Driving Directions  [id number]  [address], [place], [place] [address]  . Monday-Friday, 12 PM to 6 PM    Your e-visit answers were reviewed by a board certified advanced clinical practitioner to complete your personal care  plan.  Thank you for using e-Visits.

## 2019-04-15 DIAGNOSIS — M5442 Lumbago with sciatica, left side: Secondary | ICD-10-CM | POA: Diagnosis not present

## 2019-07-02 DIAGNOSIS — Z20828 Contact with and (suspected) exposure to other viral communicable diseases: Secondary | ICD-10-CM | POA: Diagnosis not present

## 2019-07-02 DIAGNOSIS — U071 COVID-19: Secondary | ICD-10-CM | POA: Diagnosis not present

## 2019-08-18 ENCOUNTER — Encounter (HOSPITAL_COMMUNITY): Payer: Self-pay

## 2019-08-18 ENCOUNTER — Other Ambulatory Visit: Payer: Self-pay

## 2019-08-18 ENCOUNTER — Emergency Department (HOSPITAL_COMMUNITY): Payer: Medicaid Other

## 2019-08-18 ENCOUNTER — Emergency Department (HOSPITAL_COMMUNITY)
Admission: EM | Admit: 2019-08-18 | Discharge: 2019-08-18 | Disposition: A | Discharge status: Home / Self Care | Payer: Medicaid Other | Attending: Emergency Medicine | Admitting: Emergency Medicine

## 2019-08-18 DIAGNOSIS — Z5321 Procedure and treatment not carried out due to patient leaving prior to being seen by health care provider: Secondary | ICD-10-CM | POA: Diagnosis not present

## 2019-08-18 DIAGNOSIS — R519 Headache, unspecified: Secondary | ICD-10-CM | POA: Diagnosis not present

## 2019-08-18 DIAGNOSIS — R079 Chest pain, unspecified: Secondary | ICD-10-CM | POA: Insufficient documentation

## 2019-08-18 DIAGNOSIS — K219 Gastro-esophageal reflux disease without esophagitis: Secondary | ICD-10-CM | POA: Diagnosis not present

## 2019-08-18 DIAGNOSIS — R110 Nausea: Secondary | ICD-10-CM | POA: Insufficient documentation

## 2019-08-18 DIAGNOSIS — R0789 Other chest pain: Secondary | ICD-10-CM | POA: Diagnosis not present

## 2019-08-18 LAB — CBC
HCT: 43 % (ref 36.0–46.0)
Hemoglobin: 13.6 g/dL (ref 12.0–15.0)
MCH: 26.6 pg (ref 26.0–34.0)
MCHC: 31.6 g/dL (ref 30.0–36.0)
MCV: 84.1 fL (ref 80.0–100.0)
Platelets: 319 10*3/uL (ref 150–400)
RBC: 5.11 MIL/uL (ref 3.87–5.11)
RDW: 14.1 % (ref 11.5–15.5)
WBC: 5.4 10*3/uL (ref 4.0–10.5)
nRBC: 0 % (ref 0.0–0.2)

## 2019-08-18 LAB — BASIC METABOLIC PANEL
Anion gap: 7 (ref 5–15)
BUN: 7 mg/dL (ref 6–20)
CO2: 27 mmol/L (ref 22–32)
Calcium: 9.8 mg/dL (ref 8.9–10.3)
Chloride: 104 mmol/L (ref 98–111)
Creatinine, Ser: 0.94 mg/dL (ref 0.44–1.00)
GFR calc Af Amer: >60 mL/min (ref >60)
GFR calc non Af Amer: >60 mL/min (ref >60)
Glucose, Bld: 93 mg/dL (ref 70–99)
Potassium: 4.3 mmol/L (ref 3.5–5.1)
Sodium: 138 mmol/L (ref 135–145)

## 2019-08-18 LAB — I-STAT BETA HCG BLOOD, ED (MC, WL, AP ONLY): I-stat hCG, quantitative: <5 m[IU]/mL (ref <5)

## 2019-08-18 LAB — TROPONIN I (HIGH SENSITIVITY): Troponin I (High Sensitivity): <2 ng/L (ref <18)

## 2019-08-18 MED ORDER — SODIUM CHLORIDE 0.9% FLUSH: 3.0000 mL | Freq: Once | INTRAVENOUS | Status: DC

## 2019-08-18 NOTE — ED Triage Notes (Signed)
Pt reports intermittent chest pain for the past 4 days, also having a severe headache with nausea. Denies any cardiac hx or hx of migraines.

## 2019-08-18 NOTE — ED Notes (Signed)
Pt called for room with no answer, pt no where to be found in lobby

## 2019-08-19 DIAGNOSIS — R0789 Other chest pain: Secondary | ICD-10-CM | POA: Diagnosis not present

## 2019-08-19 DIAGNOSIS — R079 Chest pain, unspecified: Secondary | ICD-10-CM | POA: Diagnosis not present

## 2019-08-19 DIAGNOSIS — K219 Gastro-esophageal reflux disease without esophagitis: Secondary | ICD-10-CM | POA: Diagnosis not present

## 2019-08-20 DIAGNOSIS — K219 Gastro-esophageal reflux disease without esophagitis: Secondary | ICD-10-CM | POA: Diagnosis not present

## 2019-08-20 DIAGNOSIS — F419 Anxiety disorder, unspecified: Secondary | ICD-10-CM | POA: Diagnosis not present

## 2019-08-20 DIAGNOSIS — M5431 Sciatica, right side: Secondary | ICD-10-CM | POA: Diagnosis not present

## 2019-08-20 DIAGNOSIS — F5101 Primary insomnia: Secondary | ICD-10-CM | POA: Diagnosis not present

## 2019-08-20 DIAGNOSIS — F331 Major depressive disorder, recurrent, moderate: Secondary | ICD-10-CM | POA: Insufficient documentation

## 2019-08-20 DIAGNOSIS — E079 Disorder of thyroid, unspecified: Secondary | ICD-10-CM | POA: Diagnosis not present

## 2019-08-20 DIAGNOSIS — G542 Cervical root disorders, not elsewhere classified: Secondary | ICD-10-CM | POA: Diagnosis not present

## 2019-08-28 DIAGNOSIS — R531 Weakness: Secondary | ICD-10-CM | POA: Diagnosis not present

## 2019-08-28 DIAGNOSIS — R5383 Other fatigue: Secondary | ICD-10-CM | POA: Diagnosis not present

## 2019-09-02 DIAGNOSIS — F411 Generalized anxiety disorder: Secondary | ICD-10-CM | POA: Diagnosis not present

## 2019-09-12 DIAGNOSIS — R29898 Other symptoms and signs involving the musculoskeletal system: Secondary | ICD-10-CM | POA: Diagnosis not present

## 2019-09-12 DIAGNOSIS — R200 Anesthesia of skin: Secondary | ICD-10-CM | POA: Diagnosis not present

## 2019-09-12 DIAGNOSIS — R531 Weakness: Secondary | ICD-10-CM | POA: Diagnosis not present

## 2019-10-27 ENCOUNTER — Other Ambulatory Visit: Payer: Self-pay

## 2019-10-27 ENCOUNTER — Ambulatory Visit: Payer: Medicaid Other | Attending: Family

## 2019-10-28 DIAGNOSIS — Z23 Encounter for immunization: Secondary | ICD-10-CM | POA: Diagnosis not present

## 2020-03-28 DIAGNOSIS — R05 Cough: Secondary | ICD-10-CM | POA: Diagnosis not present

## 2020-03-28 DIAGNOSIS — R350 Frequency of micturition: Secondary | ICD-10-CM | POA: Diagnosis not present

## 2020-03-28 DIAGNOSIS — Z760 Encounter for issue of repeat prescription: Secondary | ICD-10-CM | POA: Diagnosis not present

## 2020-03-28 DIAGNOSIS — R300 Dysuria: Secondary | ICD-10-CM | POA: Diagnosis not present

## 2020-07-22 DIAGNOSIS — M549 Dorsalgia, unspecified: Secondary | ICD-10-CM | POA: Diagnosis not present

## 2020-07-22 DIAGNOSIS — N3001 Acute cystitis with hematuria: Secondary | ICD-10-CM | POA: Diagnosis not present

## 2021-02-01 ENCOUNTER — Encounter: Payer: Self-pay | Admitting: Family Medicine

## 2021-02-01 ENCOUNTER — Ambulatory Visit: Payer: Medicaid Other | Admitting: Family

## 2021-02-03 ENCOUNTER — Encounter: Payer: Self-pay | Admitting: Family

## 2021-07-23 ENCOUNTER — Encounter (HOSPITAL_COMMUNITY): Payer: Self-pay

## 2021-07-23 ENCOUNTER — Other Ambulatory Visit: Payer: Self-pay

## 2021-07-23 ENCOUNTER — Emergency Department (HOSPITAL_COMMUNITY)
Admission: EM | Admit: 2021-07-23 | Discharge: 2021-07-23 | Disposition: A | Discharge status: Home / Self Care | Payer: Medicaid Other | Attending: Emergency Medicine | Admitting: Emergency Medicine

## 2021-07-23 DIAGNOSIS — Z8541 Personal history of malignant neoplasm of cervix uteri: Secondary | ICD-10-CM | POA: Diagnosis not present

## 2021-07-23 DIAGNOSIS — N946 Dysmenorrhea, unspecified: Secondary | ICD-10-CM | POA: Insufficient documentation

## 2021-07-23 DIAGNOSIS — N9489 Other specified conditions associated with female genital organs and menstrual cycle: Secondary | ICD-10-CM | POA: Insufficient documentation

## 2021-07-23 LAB — URINALYSIS, ROUTINE W REFLEX MICROSCOPIC
Bacteria, UA: NONE SEEN
Bilirubin Urine: NEGATIVE
Glucose, UA: NEGATIVE mg/dL
Ketones, ur: NEGATIVE mg/dL
Nitrite: NEGATIVE
Protein, ur: 30 mg/dL — AB
Specific Gravity, Urine: 1.026 (ref 1.005–1.030)
pH: 6 (ref 5.0–8.0)

## 2021-07-23 LAB — BASIC METABOLIC PANEL
Anion gap: 7 (ref 5–15)
BUN: 9 mg/dL (ref 6–20)
CO2: 27 mmol/L (ref 22–32)
Calcium: 8.6 mg/dL — ABNORMAL LOW (ref 8.9–10.3)
Chloride: 106 mmol/L (ref 98–111)
Creatinine, Ser: 0.92 mg/dL (ref 0.44–1.00)
GFR, Estimated: >60 mL/min (ref >60)
Glucose, Bld: 87 mg/dL (ref 70–99)
Potassium: 3.7 mmol/L (ref 3.5–5.1)
Sodium: 140 mmol/L (ref 135–145)

## 2021-07-23 LAB — CBC WITH DIFFERENTIAL/PLATELET
Abs Immature Granulocytes: 0.02 10*3/uL (ref 0.00–0.07)
Basophils Absolute: 0.1 10*3/uL (ref 0.0–0.1)
Basophils Relative: 1 %
Eosinophils Absolute: 0.1 10*3/uL (ref 0.0–0.5)
Eosinophils Relative: 2 %
HCT: 41.1 % (ref 36.0–46.0)
Hemoglobin: 13.6 g/dL (ref 12.0–15.0)
Immature Granulocytes: 0 %
Lymphocytes Relative: 51 %
Lymphs Abs: 3.5 10*3/uL (ref 0.7–4.0)
MCH: 28 pg (ref 26.0–34.0)
MCHC: 33.1 g/dL (ref 30.0–36.0)
MCV: 84.7 fL (ref 80.0–100.0)
Monocytes Absolute: 0.6 10*3/uL (ref 0.1–1.0)
Monocytes Relative: 8 %
Neutro Abs: 2.7 10*3/uL (ref 1.7–7.7)
Neutrophils Relative %: 38 %
Platelets: 287 10*3/uL (ref 150–400)
RBC: 4.85 MIL/uL (ref 3.87–5.11)
RDW: 13.9 % (ref 11.5–15.5)
WBC: 7 10*3/uL (ref 4.0–10.5)
nRBC: 0 % (ref 0.0–0.2)

## 2021-07-23 LAB — HCG, QUANTITATIVE, PREGNANCY: hCG, Beta Chain, Quant, S: <1 m[IU]/mL (ref <5)

## 2021-07-23 MED ORDER — IBUPROFEN 600 MG PO TABS: 600.0000 mg | ORAL_TABLET | Freq: Three times a day (TID) | ORAL | 0 refills | Status: DC

## 2021-07-23 MED ORDER — HYDROCODONE-ACETAMINOPHEN 5-325 MG PO TABS: 1.0000 | ORAL_TABLET | Freq: Four times a day (QID) | ORAL | 0 refills | Status: DC | PRN

## 2021-07-23 MED ORDER — IBUPROFEN 800 MG PO TABS
800.0000 mg | ORAL_TABLET | Freq: Once | ORAL | Status: AC
  Administered 2021-07-23: 800 mg via ORAL
  Filled 2021-07-23: qty 1

## 2021-07-23 NOTE — ED Notes (Signed)
Pt verbalized understanding of no driving and to use caution within 4 hours of taking pain meds due to meds cause drowsiness 

## 2021-07-23 NOTE — Discharge Instructions (Signed)
Your lab test today are reassuring, however you may need further evaluation if your symptoms persist.  I recommend calling [facility] to arrange follow-up care.  In the interim you are being prescribed some medications to help you with your cramping and pain.  Do not drive within 4 hours of taking hydrocodone as this medication will make you drowsy.  A heating pad applied to your lower abdomen also can help with menstrual cramping.

## 2021-07-23 NOTE — ED Triage Notes (Signed)
Pt presents to ED from home with complaints of "heavy menstrual cycle" , abd pain, and nausea. Pt says she "started 2 days ago, but just got off period two weeks ago"

## 2021-07-25 MED FILL — Hydrocodone-Acetaminophen Tab 5-325 MG: ORAL | Qty: 6 | Status: AC

## 2021-07-25 NOTE — ED Provider Notes (Signed)
[facility] EMERGENCY DEPARTMENT Provider Note   CSN: [id number] Arrival date & time: 07/23/21  5462     History  Chief Complaint  Patient presents with   Menstrual Problem    "Heavy bleeding" "abd pain"    Terri Flowers is a 41 y.o. female with a history significant for cervical cancer (describing a cryosurgery about 8 [place]) developing a heavy menstrual cycle 2 days ago, despite having her regular period 2 weeks ago.  She reports being very regular with her cycles until now.  Her bleeding is currently much less, described as simply spotting but was heavy when it first started 2 days ago.  She states she never went to her f/u care for her cervical cancer diagnosis but now has concerns.  She denies abdominal pain, but has had cramping and nausea. Denies vaginal dc, no risk of std's.  Pain radiates into her lower back.  She denies dysuria or urinary frequency, no h/o kidney stones.  Good appetite, no unexplained weight loss. No fevers or chills.    The history is provided by the patient.      Home Medications Prior to Admission medications   Medication Sig Start Date End Date Taking? Authorizing Provider  HYDROcodone-acetaminophen (NORCO/VICODIN) 5-325 MG tablet Take 1 tablet by mouth every 6 (six) hours as needed for severe pain. 07/23/21  Yes Himani Corona, Almyra Free, PA-C  HYDROcodone-acetaminophen (NORCO/VICODIN) 5-325 MG tablet Take 1 tablet by mouth every 6 (six) hours as needed for moderate pain. 07/23/21  Yes Finnis Colee, Almyra Free, PA-C  ibuprofen (ADVIL) 600 MG tablet Take 1 tablet (600 mg total) by mouth 3 (three) times daily. 07/23/21  Yes Marijane Trower, Almyra Free, PA-C  fluconazole (DIFLUCAN) 150 MG tablet Take 1 tablet (150 mg total) by mouth every three (3) days as needed. 02/17/19   Evelina Dun A, FNP  gabapentin (NEURONTIN) 300 MG capsule Take 1 capsule (300 mg total) by mouth 3 (three) times daily. 02/17/19   Evelina Dun A, FNP  sulfamethoxazole-trimethoprim (BACTRIM DS) 800-160  MG tablet Take 1 tablet by mouth 2 (two) times daily. 02/17/19   Sharion Balloon, FNP      Allergies    Penicillins    Review of Systems   Review of Systems  Constitutional:  Negative for fever.  HENT:  Negative for congestion and sore throat.   Eyes: Negative.   Respiratory:  Negative for chest tightness and shortness of breath.   Cardiovascular:  Negative for chest pain.  Gastrointestinal:  Positive for nausea. Negative for abdominal pain.  Genitourinary:  Positive for menstrual problem and pelvic pain. Negative for dysuria, flank pain and vaginal discharge.  Musculoskeletal:  Negative for arthralgias, joint swelling and neck pain.  Skin: Negative.  Negative for rash and wound.  Neurological:  Negative for dizziness, weakness, light-headedness, numbness and headaches.  Psychiatric/Behavioral: Negative.     Physical Exam Updated Vital Signs BP 116/72 (BP Location: Left Arm)    Pulse 81    Temp 98.3 F (36.8 C) (Oral)    Resp 16    Ht 5\' 5"  (1.651 m)    Wt 106.1 kg    LMP 07/21/2021 (Exact Date)    SpO2 97%    BMI 38.94 kg/m  Physical Exam Vitals and nursing note reviewed.  Constitutional:      Appearance: She is well-developed.  HENT:     Head: Normocephalic and atraumatic.  Eyes:     Conjunctiva/sclera: Conjunctivae normal.  Cardiovascular:     Rate  and Rhythm: Normal rate and regular rhythm.     Heart sounds: Normal heart sounds.  Pulmonary:     Effort: Pulmonary effort is normal.     Breath sounds: Normal breath sounds. No wheezing.  Abdominal:     General: Bowel sounds are normal. There is no distension.     Palpations: Abdomen is soft. There is no mass.     Tenderness: There is no abdominal tenderness. There is no guarding.  Genitourinary:    Comments: deferred Musculoskeletal:        General: Normal range of motion.     Cervical back: Normal range of motion.  Skin:    General: Skin is warm and dry.  Neurological:     General: No focal deficit present.      Mental Status: She is alert.    ED Results / Procedures / Treatments   Labs (all labs ordered are listed, but only abnormal results are displayed) Labs Reviewed  BASIC METABOLIC PANEL - Abnormal; Notable for the following components:      Result Value   Calcium 8.6 (*)    All other components within normal limits  URINALYSIS, ROUTINE W REFLEX MICROSCOPIC - Abnormal; Notable for the following components:   Color, Urine AMBER (*)    APPearance HAZY (*)    Hgb urine dipstick MODERATE (*)    Protein, ur 30 (*)    Leukocytes,Ua MODERATE (*)    All other components within normal limits  CBC WITH DIFFERENTIAL/PLATELET  HCG, QUANTITATIVE, PREGNANCY    EKG None  Radiology No results found.  Procedures Procedures    Medications Ordered in ED Medications  ibuprofen (ADVIL) tablet 800 mg (800 mg Oral Given 07/23/21 2259)    ED Course/ Medical Decision Making/ A&P                           Medical Decision Making Pt with new episode of intramenstrual bleeding, improved today but still with menstrual cramping. Ddx including hormonal/premenstrual changes, endometriosis, fibroids, cannot exclude recurrence of cervical cancer.  Doubt std, pt denies risk factors.  Amount and/or Complexity of Data Reviewed Labs: ordered.    Details: labs obtained are reasuring, no anemia with no significant blood loss from menses.  Risk Prescription drug management. Risk Details: Pt was prescribed pain medication for sx relief.   Pt was strongly encouraged to seek gyn care, referral given to [facility].  She has not undergone any f/u care or routine PAP screenings since her gyn procedure 8+ years ago. Reiterated the importance of obtaining this care.         Final Clinical Impression(s) / ED Diagnoses Final diagnoses:  Dysmenorrhea    Rx / DC Orders ED Discharge Orders          Ordered    HYDROcodone-acetaminophen (NORCO/VICODIN) 5-325 MG tablet  Every 6 hours PRN        07/23/21  2244    ibuprofen (ADVIL) 600 MG tablet  3 times daily        07/23/21 2246    HYDROcodone-acetaminophen (NORCO/VICODIN) 5-325 MG tablet  Every 6 hours PRN        07/23/21 2246              Evalee Jefferson, PA-C 07/26/21 2016    Milton Ferguson, MD 07/27/21 [phone]

## 2021-07-26 ENCOUNTER — Other Ambulatory Visit: Payer: Self-pay

## 2021-07-26 ENCOUNTER — Ambulatory Visit: Payer: Medicaid Other | Admitting: Family

## 2021-07-26 ENCOUNTER — Encounter: Payer: Self-pay | Admitting: Family

## 2021-07-26 VITALS — BP 121/72 | HR 71 | Temp 97.3°F | Ht 65.0 in | Wt 212.6 lb

## 2021-07-26 DIAGNOSIS — Z8541 Personal history of malignant neoplasm of cervix uteri: Secondary | ICD-10-CM

## 2021-07-26 DIAGNOSIS — N946 Dysmenorrhea, unspecified: Secondary | ICD-10-CM | POA: Diagnosis not present

## 2021-07-26 DIAGNOSIS — Z09 Encounter for follow-up examination after completed treatment for conditions other than malignant neoplasm: Secondary | ICD-10-CM | POA: Diagnosis not present

## 2021-07-26 NOTE — Patient Instructions (Signed)
Dysmenorrhea °Dysmenorrhea refers to cramps caused by the muscles of the uterus tightening (contracting) during a menstrual period. Dysmenorrhea may be mild, or it may be severe enough to interfere with everyday activities for a few days each month. Primary dysmenorrhea is menstrual cramps that last a couple of days when a female starts having menstrual periods or soon after. As a female gets older or has a baby, the cramps will usually lessen or disappear. °Secondary dysmenorrhea begins later in life and is caused by a disorder in the reproductive system. It lasts longer, and it may cause more pain than primary dysmenorrhea. The pain may start before the period and last a few days after the period. °What are the causes? °Dysmenorrhea is usually caused by an underlying problem, such as: °Endometriosis. The tissue that lines the uterus (endometrium) growing outside of the uterus in other areas of the body. °Adenomyosis. Endometrial tissue growing into the muscular walls of the uterus. °Pelvic congestive syndrome. Blood vessels in the pelvis that fill with blood just before the menstrual period. °Overgrowth of cells (polyps) in the endometrium or the lower part of the uterus (cervix). °Uterine prolapse. The uterus dropping down into the vagina due to stretched or weak muscles. °Bladder problems, such as infection or inflammation. °Intestinal problems, such as a tumor or irritable bowel syndrome. °Cancer of the reproductive organs or bladder. °Other causes of this condition may result from: °A severely tipped uterus. °A cervix that is closed or has a small opening. °Noncancerous (benign) tumors in the uterus (fibroids). °Pelvic inflammatory disease (PID). °Pelvic scarring (adhesions) from a previous surgery. °An ovarian cyst. °An IUD (intrauterine device). °What increases the risk? °You are more likely to develop this condition if: °You are younger than 40 years old. °You started puberty early. °You have irregular or  heavy bleeding. °You have never given birth. °You have a family history of dysmenorrhea. °You smoke or use nicotine products. °You have high body weight or a low body weight. °What are the signs or symptoms? °Symptoms of this condition include: °Cramping, throbbing pain in lower abdomen or lower back, or a feeling of fullness in the lower abdomen. °Periods lasting for longer than 7 days. °Headaches. °Bloating. °Fatigue. °Nausea or vomiting. °Diarrhea or loose stools. °Sweating or dizziness. °How is this diagnosed? °This condition may be diagnosed based on: °Your symptoms. °Your medical history. °A physical exam. °Blood tests. °A Pap test. This is a test in which cells from the cervix are tested for signs of cancer or infection. °A pregnancy test. °You may also have other tests, including: °Imaging tests, such as: °Ultrasound. °A procedure to remove and examine a sample of endometrial tissue (dilation and curettage, D&C). °A procedure to visually examine the inside of: °The uterus (hysteroscopy). °The abdomen or pelvis (laparoscopy). °The bladder (cystoscopy). °X-rays. °CT scan. °MRI. °How is this treated? °Treatment depends on the cause of the dysmenorrhea. Treatment may include medicines, such as: °Pain medicines. °Hormone replacement therapy. °Injections of progesterone to stop the menstrual period. °Birth control pills that contain the hormone progesterone. °An IUD that contains the hormone progesterone. °NSAIDs, such as ibuprofen. These may help to stop the production of hormones that cause cramps. °Antidepressant medicines. °Other treatment may include: °Surgery to remove adhesions, endometriosis, ovarian cysts, fibroids, or the entire uterus (hysterectomy). °Endometrial ablation. This is a procedure to destroy the endometrium. °Presacral neurectomy. This is a procedure to cut the nerves in the bottom of the spine (sacrum) that go to the reproductive organs. °Sacral nerve   stimulation. This is a procedure to  apply an electric current to nerves in the sacrum. °Exercise and physical therapy. °Meditation, yoga, and acupuncture. °Work with your health care provider to determine what treatment or combination of treatments is best for you. °Follow these instructions at home: °Relieving pain and cramping ° °If directed, apply heat to your lower back or abdomen when you experience pain or cramps. Use the heat source that your health care provider recommends, such as a moist heat pack or a heating pad. °Place a towel between your skin and the heat source. °Leave the heat on for 20-30 minutes. °Remove the heat if your skin turns bright red. This is especially important if you are unable to feel pain, heat, or cold. You may have a greater risk of getting burned. °Do not sleep with a heating pad on. °Exercise. Activities such as walking, swimming, or biking can help to relieve cramps. °Massage your lower back or abdomen to help relieve pain. °General instructions °Take over-the-counter and prescription medicines only as told by your health care provider. °Ask your health care provider if the medicine prescribed to you requires you to avoid driving or using machinery. °Avoid alcohol and caffeine during and right before your period. These can make cramps worse. °Do not use any products that contain nicotine or tobacco. These products include cigarettes, chewing tobacco, and vaping devices, such as e-cigarettes. If you need help quitting, ask your health care provider. °Keep all follow-up visits. This is important. °Contact a health care provider if: °You have pain that gets worse or does not get better with medicine. °You have pain with sex. °You develop nausea or vomiting with your period that is not controlled with medicine. °Get help right away if: °You faint. °Summary °Dysmenorrhea refers to cramps caused by the muscles of the uterus tightening (contracting) during a menstrual period. °Dysmenorrhea may be mild, or it may be  severe enough to interfere with everyday activities for a few days each month. °Treatment depends on the cause of the dysmenorrhea. °Work with your health care provider to determine what treatment or combination of treatments is best for you. °This information is not intended to replace advice given to you by your health care provider. Make sure you discuss any questions you have with your health care provider. °Document Revised: 02/18/2020 Document Reviewed: 02/18/2020 °Elsevier Patient Education © 2022 [employer] ° °

## 2021-07-26 NOTE — Progress Notes (Signed)
Subjective:    Patient ID: Terri Flowers, female    DOB: Sep 15, 1980, 41 y.o.   MRN: [id number]  Chief Complaint  Patient presents with   Hospitalization Follow-up   Pt presents to the office today for hospital follow up. She went to the ED on 07/23/21 with dysmenorrhea. She reports started having vaginal bleeding on 07/22/21 with cramping. She reports she had her menstrual cycle 07/10/21. She had stable CBC, BMP, and urine. She had negative pregnancy test.   She reports she has a hx of cervical cancer. She has an appointment with her GYN 09/05/21. Vaginal Bleeding The patient's primary symptoms include vaginal bleeding. The patient's pertinent negatives include no genital itching, genital lesions or genital odor. The problem has been rapidly improving. The pain is moderate. Associated symptoms include back pain. Pertinent negatives include no constipation, diarrhea, discolored urine, dysuria, flank pain, frequency, sore throat or urgency. There has been no bleeding.     Review of Systems  HENT:  Negative for sore throat.   Gastrointestinal:  Negative for constipation and diarrhea.  Genitourinary:  Positive for vaginal bleeding. Negative for dysuria, flank pain, frequency and urgency.  Musculoskeletal:  Positive for back pain.  All other systems reviewed and are negative.     Objective:   Physical Exam Vitals reviewed.  Constitutional:      General: She is not in acute distress.    Appearance: She is well-developed.  HENT:     Head: Normocephalic and atraumatic.  Eyes:     Pupils: Pupils are equal, round, and reactive to light.  Neck:     Thyroid: No thyromegaly.  Cardiovascular:     Rate and Rhythm: Normal rate and regular rhythm.     Heart sounds: Normal heart sounds. No murmur heard. Pulmonary:     Effort: Pulmonary effort is normal. No respiratory distress.     Breath sounds: Normal breath sounds. No wheezing.  Abdominal:     General: Bowel sounds are normal. There is  no distension.     Palpations: Abdomen is soft.     Tenderness: There is abdominal tenderness (mild lower left).  Musculoskeletal:        General: No tenderness. Normal range of motion.     Cervical back: Normal range of motion and neck supple.  Skin:    General: Skin is warm and dry.  Neurological:     Mental Status: She is alert and oriented to person, place, and time.     Cranial Nerves: No cranial nerve deficit.     Deep Tendon Reflexes: Reflexes are normal and symmetric.  Psychiatric:        Behavior: Behavior normal.        Thought Content: Thought content normal.        Judgment: Judgment normal.    BP 121/72    Pulse 71    Temp (!) 97.3 F (36.3 C) (Temporal)    Ht 5\' 5"  (1.651 m)    Wt 212 lb 9.6 oz (96.4 kg)    LMP 07/21/2021 (Exact Date)    BMI 35.38 kg/m        Assessment & Plan:  Reagyn Facemire comes in today with chief complaint of Hospitalization Follow-up   Diagnosis and orders addressed:  1. Dysmenorrhea - US Pelvic Complete With Transvaginal; Future  2. Hospital discharge follow-up - US Pelvic Complete With Transvaginal; Future  3. Hx of cervical cancer - US Pelvic Complete With Transvaginal; Future   Labs reviewed Keep appt with  GYN  Health Maintenance reviewed Diet and exercise encouraged  Follow up plan: As needed    Evelina Dun, FNP

## 2021-08-02 ENCOUNTER — Other Ambulatory Visit: Payer: Self-pay

## 2021-08-02 ENCOUNTER — Ambulatory Visit (HOSPITAL_COMMUNITY)
Admission: RE | Admit: 2021-08-02 | Discharge: 2021-08-02 | Disposition: A | Discharge status: Home / Self Care | Payer: Medicaid Other | Source: Ambulatory Visit | Attending: Family | Admitting: Family

## 2021-08-02 DIAGNOSIS — Z09 Encounter for follow-up examination after completed treatment for conditions other than malignant neoplasm: Secondary | ICD-10-CM | POA: Diagnosis not present

## 2021-08-02 DIAGNOSIS — N888 Other specified noninflammatory disorders of cervix uteri: Secondary | ICD-10-CM | POA: Diagnosis not present

## 2021-08-02 DIAGNOSIS — N946 Dysmenorrhea, unspecified: Secondary | ICD-10-CM | POA: Insufficient documentation

## 2021-08-02 DIAGNOSIS — Z8541 Personal history of malignant neoplasm of cervix uteri: Secondary | ICD-10-CM | POA: Diagnosis not present

## 2021-09-05 ENCOUNTER — Other Ambulatory Visit: Payer: Self-pay

## 2021-09-05 ENCOUNTER — Encounter: Payer: Self-pay | Admitting: Adult Health

## 2021-09-05 ENCOUNTER — Ambulatory Visit: Payer: Medicaid Other | Admitting: Adult Health

## 2021-09-05 ENCOUNTER — Other Ambulatory Visit (HOSPITAL_COMMUNITY)
Admission: RE | Admit: 2021-09-05 | Discharge: 2021-09-05 | Disposition: A | Discharge status: Home / Self Care | Payer: Medicaid Other | Source: Ambulatory Visit | Attending: Adult Health | Admitting: Adult Health

## 2021-09-05 VITALS — BP 126/86 | HR 98 | Ht 65.0 in | Wt 213.5 lb

## 2021-09-05 DIAGNOSIS — F419 Anxiety disorder, unspecified: Secondary | ICD-10-CM

## 2021-09-05 DIAGNOSIS — Z8742 Personal history of other diseases of the female genital tract: Secondary | ICD-10-CM | POA: Diagnosis not present

## 2021-09-05 DIAGNOSIS — R102 Pelvic and perineal pain: Secondary | ICD-10-CM | POA: Diagnosis not present

## 2021-09-05 DIAGNOSIS — N926 Irregular menstruation, unspecified: Secondary | ICD-10-CM | POA: Diagnosis not present

## 2021-09-05 DIAGNOSIS — Z30011 Encounter for initial prescription of contraceptive pills: Secondary | ICD-10-CM | POA: Insufficient documentation

## 2021-09-05 DIAGNOSIS — Z124 Encounter for screening for malignant neoplasm of cervix: Secondary | ICD-10-CM | POA: Diagnosis not present

## 2021-09-05 DIAGNOSIS — F17200 Nicotine dependence, unspecified, uncomplicated: Secondary | ICD-10-CM | POA: Insufficient documentation

## 2021-09-05 DIAGNOSIS — F32A Depression, unspecified: Secondary | ICD-10-CM | POA: Diagnosis not present

## 2021-09-05 DIAGNOSIS — Z3202 Encounter for pregnancy test, result negative: Secondary | ICD-10-CM | POA: Insufficient documentation

## 2021-09-05 LAB — POCT URINE PREGNANCY: Preg Test, Ur: NEGATIVE

## 2021-09-05 MED ORDER — BUPROPION HCL ER (XL) 150 MG PO TB24: 150.0000 mg | ORAL_TABLET | Freq: Every day | ORAL | 3 refills | Status: DC

## 2021-09-05 MED ORDER — NORETHINDRONE 0.35 MG PO TABS: 1.0000 | ORAL_TABLET | Freq: Every day | ORAL | 11 refills | Status: DC

## 2021-09-05 NOTE — Progress Notes (Signed)
Patient ID: Terri Flowers, female   DOB: Jan 08, 1981, 41 y.o.   MRN: [id number] History of Present Illness: Terri Flowers is a 41 year old black female,divorced, G1P1001, in complaining of pelvic pain and having irregular bleeding. She was seen in ER 07/23/21 and HGB was 13.6, stable CBC, BMP and negative UPT.and then saw PCP 07/26/21 and had [place] ordered and it was done 08/02/21 and was normal. She says had cervical cancer in 2005 and had procedure on cervix, no paps since, she says. PCP is Kayren Eaves NP   Current Medications, Allergies, Past Medical History, Past Surgical History, Family History and Social History were reviewed in [employer] record.     Review of Systems: +pelvic pain +irregular bleeding +back pain Denies pain with sex +depression and anxiety     Physical Exam:BP 126/86 (BP Location: Left Arm, Patient Position: Sitting, Cuff Size: Large)    Pulse 98    Ht 5\' 5"  (1.651 m)    Wt 213 lb 8 oz (96.8 kg)    LMP 08/19/2021    BMI 35.53 kg/m  UPT is negative, had sex last week. General:  Well developed, well nourished, no acute distress Skin:  Warm and dry Neck:  Midline trachea, normal thyroid, good ROM, no lymphadenopathy Lungs; Clear to auscultation bilaterally Cardiovascular: Regular rate and rhythm Pelvic:  External genitalia is normal in appearance, no lesions.  The vagina is normal in appearance. Urethra has no lesions or masses. The cervix is bulbous, has nabothian cyst at 8 0' clock and irregular os, pap with GC/CHL and HR HPV genotyping performed.  Uterus is felt to be normal size, shape, and contour and retroverted.  No adnexal masses, RLQ tenderness noted.Bladder is non tender, no masses felt. Rectal: Deferred Extremities/musculoskeletal:  No swelling or varicosities noted, no clubbing or cyanosis Psych:  No mood changes, alert and cooperative,seems happy AA is 2 Fall risk is low Depression screen [facility] 2/9 09/05/2021 08/16/2018 10/15/2017  Decreased Interest 0 0  0  Down, Depressed, Hopeless 3 0 0  PHQ - 2 Score 3 0 0  Altered sleeping 3 - -  Tired, decreased energy 3 - -  Change in appetite 3 - -  Feeling bad or failure about yourself  3 - -  Trouble concentrating 0 - -  Moving slowly or fidgety/restless 0 - -  Suicidal thoughts 0 - -  PHQ-9 Score 15 - -    GAD 7 : Generalized Anxiety Score 09/05/2021  Nervous, Anxious, on Edge 2  Control/stop worrying 3  Worry too much - different things 3  Trouble relaxing 2  Restless 3  Easily annoyed or irritable 3  Afraid - awful might happen 3  Total GAD 7 Score 19    Upstream - 09/05/21 1450       Pregnancy Intention Screening   Does the patient want to become pregnant in the next year? Unsure    Does the patient's partner want to become pregnant in the next year? Unsure    Would the patient like to discuss contraceptive options today? No      Contraception Wrap Up   Current Method Oral Contraceptive    End Method Oral Contraceptive    Contraception Counseling Provided Yes            Examination chaperoned by Levy Pupa LPN    Impression and Plan: 1. Routine cervical smear Pap sent Pap in 3 years if normal  2. Pregnancy examination or test, negative result  3.  Pelvic pain Had normal [place] 08/02/21 Will try POP, Micronor, to start with next period Will follow up in 8 weeks   4. Irregular bleeding Bleeding stopped  5. History of abnormal cervical Pap smear Pap sent  6. Anxiety and depression Discussed medication and will try Wellbutrin to start and may add buspar Will follow up in 8 weeks for ROS  7. Encounter for initial prescription of contraceptive pill Will try Micronor to see if helps with bleeding and pain  Start with next period and use condoms  Meds ordered this encounter  Medications   buPROPion (WELLBUTRIN XL) 150 MG 24 hr tablet    Sig: Take 1 tablet (150 mg total) by mouth daily.    Dispense:  30 tablet    Refill:  3    Order Specific Question:    Supervising Provider    Answer:   Tania Ade H [[id number]]   norethindrone (MICRONOR) 0.35 MG tablet    Sig: Take 1 tablet (0.35 mg total) by mouth daily.    Dispense:  28 tablet    Refill:  11    Order Specific Question:   Supervising Provider    Answer:   Elonda Husky, LUTHER H [[id number]]    8. Smoker Try to decreasing smoking Wellbutrin may help with this too.   40 minutes of total time was spent for the pt encounter, she is a new pt, including reviewing chart, labs and [place] and face to face with pt and documentation of the encounter.

## 2021-09-09 LAB — CYTOLOGY - PAP
Adequacy: ABSENT
Chlamydia: NEGATIVE
Comment: NEGATIVE
Comment: NEGATIVE
Comment: NORMAL
Diagnosis: NEGATIVE
High risk HPV: NEGATIVE
Neisseria Gonorrhea: NEGATIVE

## 2021-09-27 ENCOUNTER — Other Ambulatory Visit: Payer: Self-pay | Admitting: Adult Health

## 2021-09-30 ENCOUNTER — Telehealth: Payer: Medicaid Other | Admitting: Physician Assistant

## 2021-09-30 DIAGNOSIS — H109 Unspecified conjunctivitis: Secondary | ICD-10-CM | POA: Diagnosis not present

## 2021-09-30 MED ORDER — OFLOXACIN 0.3 % OP SOLN: 1.0000 [drp] | Freq: Four times a day (QID) | OPHTHALMIC | 0 refills | Status: DC

## 2021-09-30 NOTE — Progress Notes (Signed)
E-Visit for Pink Eye ° ° °We are sorry that you are not feeling well.  Here is how we plan to help! ° °Based on what you have shared with me it looks like you have conjunctivitis.  Conjunctivitis is a common inflammatory or infectious condition of the eye that is often referred to as "pink eye".  In most cases it is contagious (viral or bacterial). However, not all conjunctivitis requires antibiotics (ex. Allergic).  We have made appropriate suggestions for you based upon your presentation. ° °I have prescribed Oflaxacin 1-2 drops 4 times a day times 5 days  ° °Pink eye can be highly contagious.  It is typically spread through direct contact with secretions, or contaminated objects or surfaces that one may have touched.  Strict handwashing is suggested with soap and water is urged.  If not available, use alcohol based had sanitizer.  Avoid unnecessary touching of the eye.  If you wear contact lenses, you will need to refrain from wearing them until you see no white discharge from the eye for at least 24 hours after being on medication.  You should see symptom improvement in 1-2 days after starting the medication regimen.  Call us if symptoms are not improved in 1-2 days. ° °Home Care: °Wash your hands often! °Do not wear your contacts until you complete your treatment plan. °Avoid sharing towels, bed linen, personal items with a person who has pink eye. °See attention for anyone in your home with similar symptoms. ° °Get Help Right Away If: °Your symptoms do not improve. °You develop blurred or loss of vision. °Your symptoms worsen (increased discharge, pain or redness) ° ° °Thank you for choosing an e-visit. ° °Your e-visit answers were reviewed by a board certified advanced clinical practitioner to complete your personal care plan. Depending upon the condition, your plan could have included both over the counter or prescription medications. ° °Please review your pharmacy choice. Make sure the pharmacy is open so  you can pick up prescription now. If there is a problem, you may contact your provider through MyChart messaging and have the prescription routed to another pharmacy.  Your safety is important to us. If you have drug allergies check your prescription carefully.  ° °For the next 24 hours you can use MyChart to ask questions about today's visit, request a non-urgent call back, or ask for a work or school excuse. °You will get an email in the next two days asking about your experience. I hope that your e-visit has been valuable and will speed your recovery. ° °I provided 5 minutes of non face-to-face time during this encounter for chart review and documentation.  ° °

## 2021-11-04 ENCOUNTER — Ambulatory Visit: Payer: Medicaid Other | Admitting: Adult Health

## 2022-04-01 DIAGNOSIS — U071 COVID-19: Secondary | ICD-10-CM | POA: Diagnosis not present

## 2022-04-01 DIAGNOSIS — R42 Dizziness and giddiness: Secondary | ICD-10-CM | POA: Diagnosis not present

## 2022-05-24 IMAGING — US US PELVIS COMPLETE WITH TRANSVAGINAL
1 series · 14 of 25 positions shown · non-contrast
Comparison: None

CLINICAL DATA: Dysmenorrhea, LMP 07/10/2021



[Series 1: gyn us · 14 of 109 slices shown]
[im 1/109]
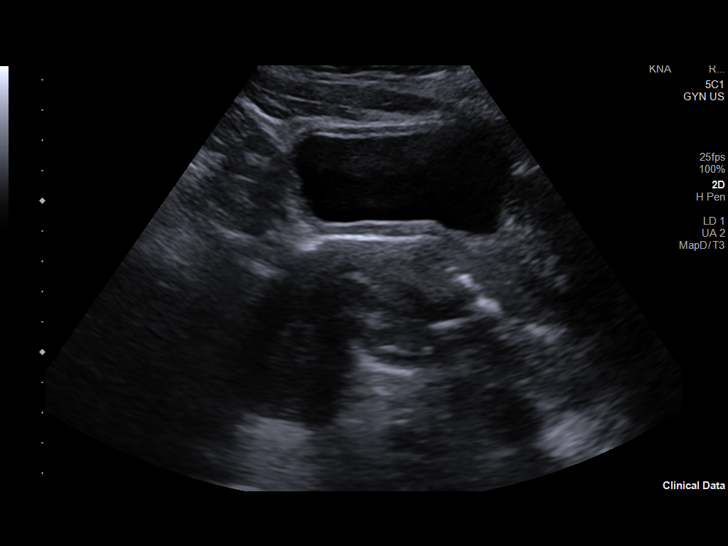
[im 10/109]
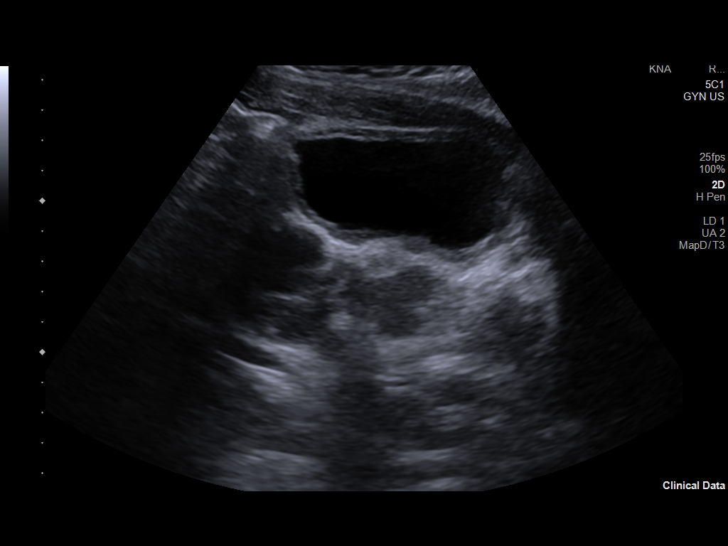
[im 19/109]
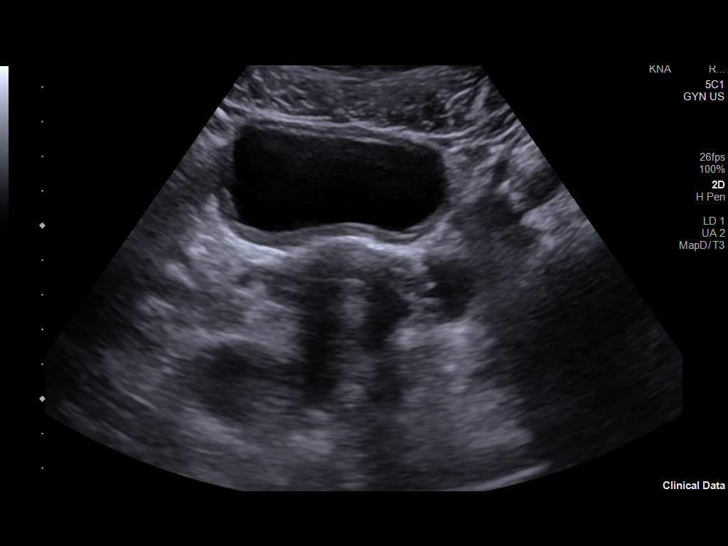
[im 28/109]
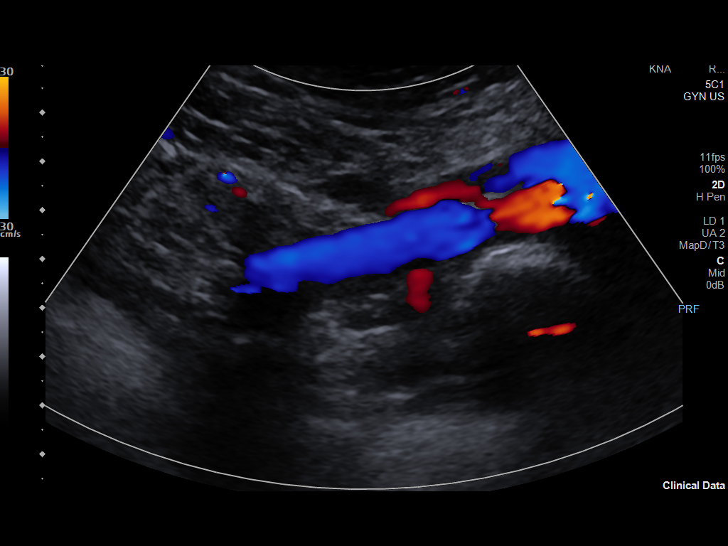
[im 37/109]
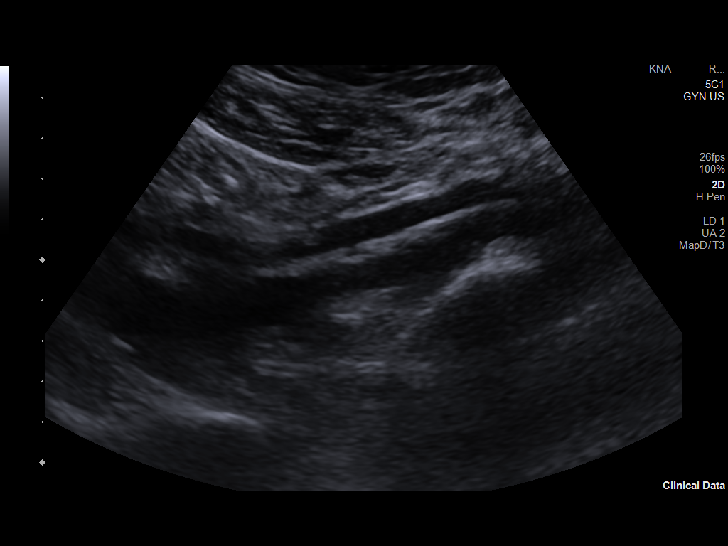
[im 41/109]
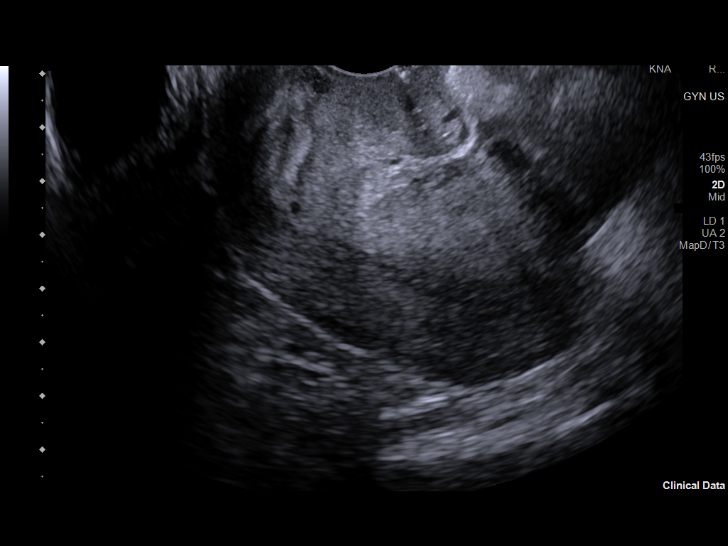
[im 50/109]
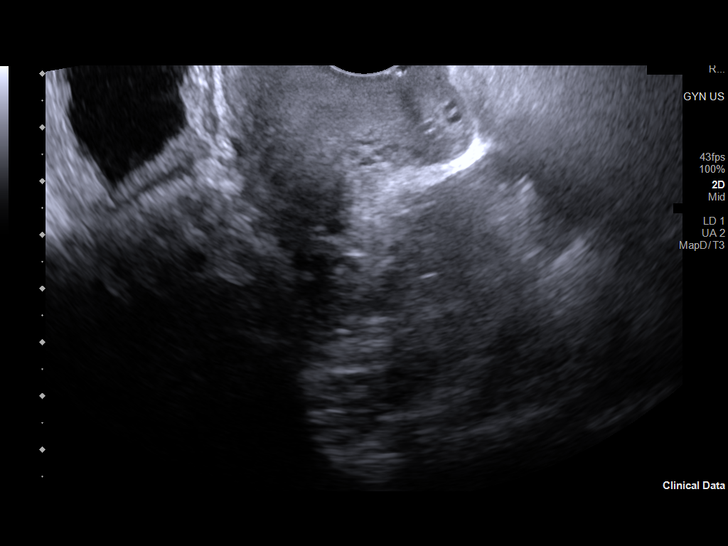
[im 59/109]
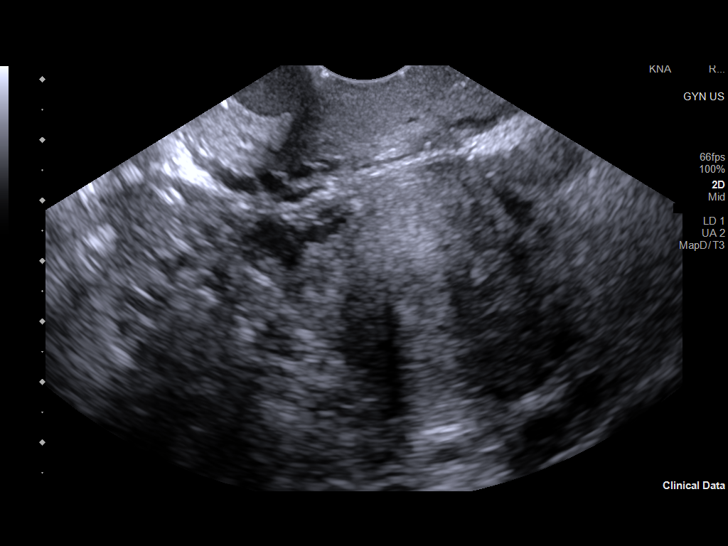
[im 68/109]
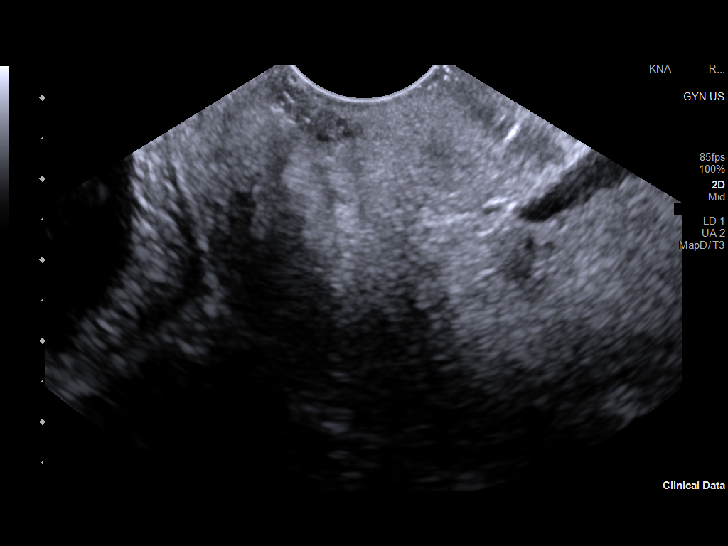
[im 73/109]
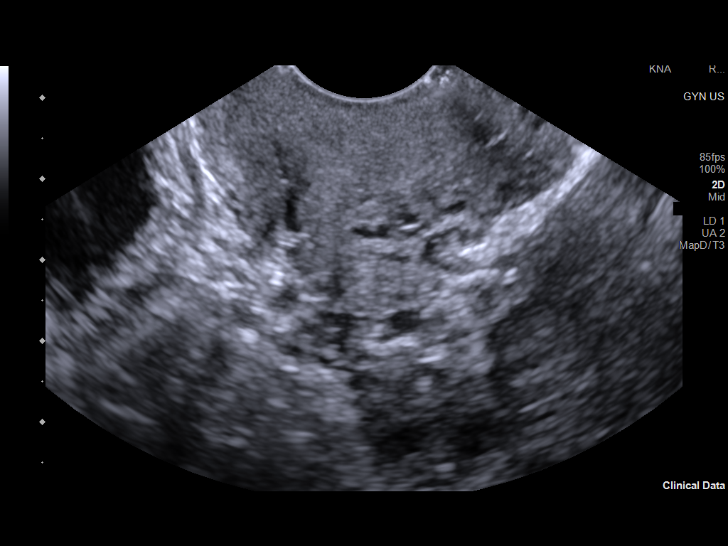
[im 82/109]
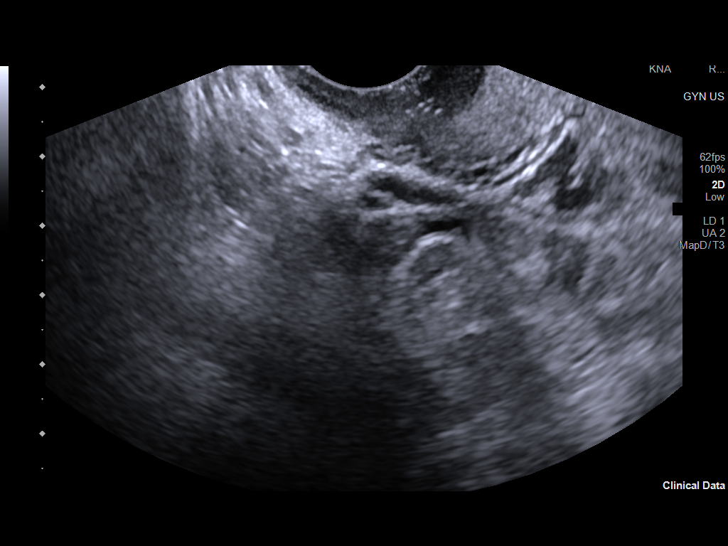
[im 91/109]
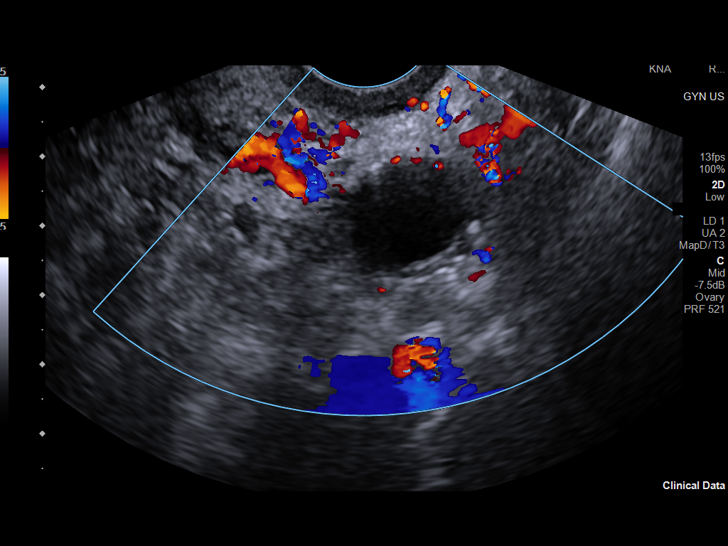
[im 100/109]
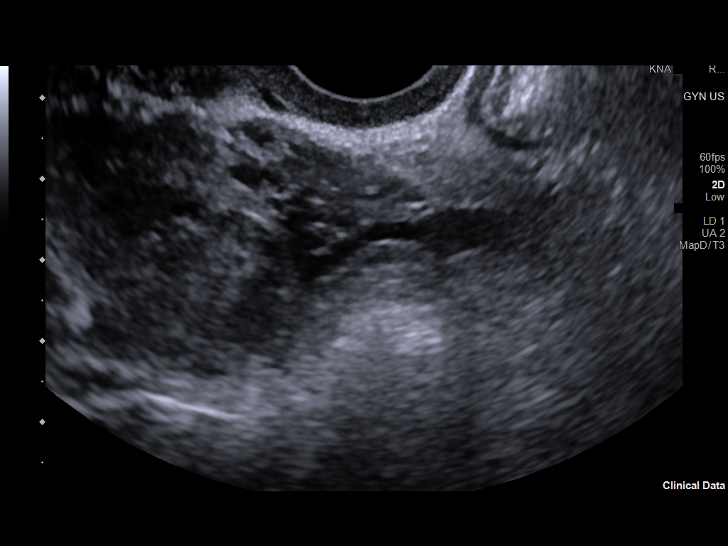
[im 109/109]
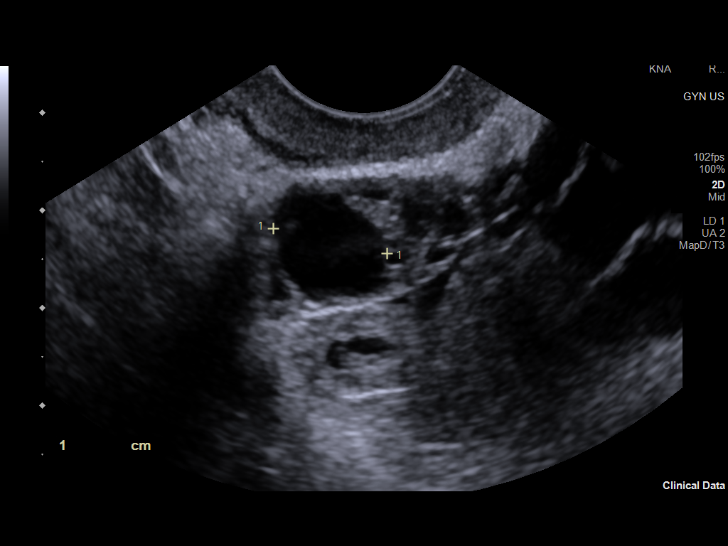

[14 of 25 positions shown; findings below may reference images not displayed]

FINDINGS: Uterus

Measurements: 7.6 x 3.8 x 5.5 cm = volume: 82 mL. Nabothian cysts in
cervix. Retroverted uterus. Heterogeneous myometrium. No focal mass.

Endometrium

Thickness: 5 mm.  No endometrial fluid or mass

Right ovary

Measurements: 2.9 x 1.8 x 2.3 cm = volume: 6.3 mL. Normal morphology
without mass

Left ovary

Measurements: 3.7 x 2.2 x 2.5 cm = volume: 10.5 mL. Normal
morphology without mass

Other findings

No free pelvic fluid or adnexal masses.
IMPRESSION: Normal exam.

## 2023-01-01 ENCOUNTER — Emergency Department (HOSPITAL_COMMUNITY)
Admission: EM | Admit: 2023-01-01 | Discharge: 2023-01-01 | Disposition: A | Discharge status: Home / Self Care | Payer: Self-pay | Attending: Emergency Medicine | Admitting: Emergency Medicine

## 2023-01-01 ENCOUNTER — Encounter (HOSPITAL_COMMUNITY): Payer: Self-pay | Admitting: Emergency Medicine

## 2023-01-01 ENCOUNTER — Emergency Department (HOSPITAL_COMMUNITY): Payer: Self-pay

## 2023-01-01 ENCOUNTER — Other Ambulatory Visit: Payer: Self-pay

## 2023-01-01 DIAGNOSIS — S91052A Open bite, left ankle, initial encounter: Secondary | ICD-10-CM | POA: Insufficient documentation

## 2023-01-01 DIAGNOSIS — W540XXA Bitten by dog, initial encounter: Secondary | ICD-10-CM | POA: Insufficient documentation

## 2023-01-01 MED ORDER — CEPHALEXIN 500 MG PO CAPS: 500.0000 mg | ORAL_CAPSULE | Freq: Two times a day (BID) | ORAL | 0 refills | Status: AC

## 2023-01-01 MED ORDER — METRONIDAZOLE 500 MG PO TABS: 500.0000 mg | ORAL_TABLET | Freq: Two times a day (BID) | ORAL | 0 refills | Status: AC

## 2023-01-01 MED ORDER — METRONIDAZOLE 500 MG PO TABS
500.0000 mg | ORAL_TABLET | Freq: Once | ORAL | Status: AC
  Administered 2023-01-01: 500 mg via ORAL
  Filled 2023-01-01: qty 1

## 2023-01-01 MED ORDER — CEPHALEXIN 500 MG PO CAPS
500.0000 mg | ORAL_CAPSULE | Freq: Once | ORAL | Status: AC
  Administered 2023-01-01: 500 mg via ORAL
  Filled 2023-01-01: qty 1

## 2023-01-01 NOTE — ED Triage Notes (Signed)
Pt had dog bite today around 3pm to left medial ankle. Bleeding controlled. Small gash noted. Pt states was someone's dog and thinks dog is not up to date on shots.

## 2023-01-01 NOTE — Discharge Instructions (Addendum)
Xray shows no foreign bodies. Please follow-up with your primary care physician for any signs of infection including redness, warmth, swelling, irritation of the wound.  Antibiotics sent to your pharmacy.  Keep area clean and dry.

## 2023-01-01 NOTE — ED Provider Notes (Signed)
EMERGENCY DEPARTMENT AT [facility] Provider Note   CSN: [id number] Arrival date & time: 01/01/23  1735     History  Chief Complaint  Patient presents with   Animal Bite    Terri Flowers is a 42 y.o. female.  Patient is a 42 year old female presenting for dog bite that occurred around 3 PM today.  The pet was a neighbors animal that she knows.  The dog was not rabid.  He was otherwise acting normally.  Patient was bit on her left medial ankle.  Admits to pain and swelling.  Able to ambulate without difficulty.  Minimal bleeding.  The history is provided by the patient. No language interpreter was used.  Animal Bite Associated symptoms: no fever        Home Medications Prior to Admission medications   Medication Sig Start Date End Date Taking? Authorizing Provider  cephALEXin (KEFLEX) 500 MG capsule Take 1 capsule (500 mg total) by mouth 2 (two) times daily for 7 days. 01/01/23 01/08/23 Yes Edwin Dada P, DO  metroNIDAZOLE (FLAGYL) 500 MG tablet Take 1 tablet (500 mg total) by mouth 2 (two) times daily for 7 days. 01/01/23 01/08/23 Yes Edwin Dada P, DO  buPROPion (WELLBUTRIN XL) 150 MG 24 hr tablet TAKE 1 TABLET BY MOUTH EVERY DAY 09/27/21   Adline Potter, NP  norethindrone (MICRONOR) 0.35 MG tablet Take 1 tablet (0.35 mg total) by mouth daily. 09/05/21   Adline Potter, NP  ofloxacin (OCUFLOX) 0.3 % ophthalmic solution Place 1 drop into the left eye 4 (four) times daily. X 5 days 09/30/21   Margaretann Loveless, PA-C  UNABLE TO FIND as needed. [facility]    [provider]      Allergies    Penicillins and Pineapple    Review of Systems   Review of Systems  Constitutional:  Negative for chills and fever.  Skin:  Positive for wound. Negative for color change.    Physical Exam Updated Vital Signs BP 110/74 (BP Location: Right Arm)   Pulse (!) 114   Temp 99 F (37.2 C) (Oral)   Resp 18   LMP 12/21/2022   SpO2 100%   Physical Exam Vitals and nursing note reviewed.  Constitutional:      Appearance: Normal appearance.  HENT:     Head: Normocephalic and atraumatic.  Cardiovascular:     Rate and Rhythm: Normal rate and regular rhythm.  Pulmonary:     Effort: Pulmonary effort is normal.  Skin:    Capillary Refill: Capillary refill takes less than 2 seconds.       Neurological:     General: No focal deficit present.     Mental Status: She is alert and oriented to person, place, and time.     ED Results / Procedures / Treatments   Labs (all labs ordered are listed, but only abnormal results are displayed) Labs Reviewed - No data to display  EKG None  Radiology DG Ankle 2 Views Left  Result Date: 01/01/2023 CLINICAL DATA:  History of recent dog bite, evaluate for foreign body, initial encounter EXAM: LEFT ANKLE - 2 VIEW COMPARISON:  None Available. FINDINGS: No acute fracture or dislocation is noted. Well corticated bony density is noted adjacent to the fibular tip consistent with prior trauma and nonunion. Similar findings are noted along the posterior aspect of the talus. Tarsal degenerative changes are seen. IMPRESSION: Findings of prior trauma with nonunion.  No acute abnormality noted.  Electronically Signed   By: Alcide Clever M.D.   On: 01/01/2023 21:03    Procedures Procedures    Medications Ordered in ED Medications  cephALEXin (KEFLEX) capsule 500 mg (500 mg Oral Given 01/01/23 2119)  metroNIDAZOLE (FLAGYL) tablet 500 mg (500 mg Oral Given 01/01/23 2119)    ED Course/ Medical Decision Making/ A&P                             Medical Decision Making Amount and/or Complexity of Data Reviewed Radiology: ordered.  Risk Prescription drug management.   42 year old female presenting for dog bite that occurred around 3 PM today.  Patient is alert and oriented x 3, no acute distress, afebrile, stable vital signs.  Patient has penicillin allergy. Will give keflex and metronidazole for  infection prophylaxis.   Wound care applied.  X-ray demonstrates no foreign bodies and no fractures.  Tetanus is not recommended at this time due to animal being a house pet and has otherwise been acting normally.  Patient in no distress and overall condition improved here in the ED. Detailed discussions were had with the patient regarding current findings, and need for close f/u with PCP or on call doctor. The patient has been instructed to return immediately if the symptoms worsen in any way for re-evaluation. Patient verbalized understanding and is in agreement with current care plan. All questions answered prior to discharge.         Final Clinical Impression(s) / ED Diagnoses Final diagnoses:  Dog bite, initial encounter    Rx / DC Orders ED Discharge Orders          Ordered    cephALEXin (KEFLEX) 500 MG capsule  2 times daily        01/01/23 2139    metroNIDAZOLE (FLAGYL) 500 MG tablet  2 times daily        01/01/23 2139              Franne Forts, DO 01/01/23 2142

## 2023-01-02 ENCOUNTER — Telehealth: Payer: Self-pay

## 2023-01-02 NOTE — Transitions of Care (Post Inpatient/ED Visit) (Signed)
   01/02/2023  Name: Terri Flowers MRN: [id number] DOB: 30-Aug-1980  Today's TOC FU Call Status: Today's TOC FU Call Status:: Successful TOC FU Call Competed TOC FU Call Complete Date: 01/02/23  Transition Care Management Follow-up Telephone Call Date of Discharge: 01/01/23 [facility] Type of Discharge: Emergency Department Reason for ED Visit: Other: (dog bite) How have you been since you were released from the hospital?: Better Any questions or concerns?: No  Items Reviewed: Did you receive and understand the discharge instructions provided?: Yes Medications obtained,verified, and reconciled?: Yes (Medications Reviewed) Any new allergies since your discharge?: No Dietary orders reviewed?: Yes Do you have support at home?: No  Medications Reviewed Today: Medications Reviewed Today     Reviewed by Karena Addison, LPN (Licensed Practical Nurse) on 01/02/23 at 1403  Med List Status: <None>   Medication Order Taking? Sig Documenting Provider Last Dose Status Informant  buPROPion (WELLBUTRIN XL) 150 MG 24 hr tablet [id number]  TAKE 1 TABLET BY MOUTH EVERY DAY Cyril Mourning A, NP  Active   cephALEXin (KEFLEX) 500 MG capsule [id number]  Take 1 capsule (500 mg total) by mouth 2 (two) times daily for 7 days. Edwin Dada P, DO  Active   metroNIDAZOLE (FLAGYL) 500 MG tablet [id number]  Take 1 tablet (500 mg total) by mouth 2 (two) times daily for 7 days. Franne Forts, DO  Active   norethindrone (MICRONOR) 0.35 MG tablet [id number]  Take 1 tablet (0.35 mg total) by mouth daily. Cyril Mourning A, NP  Active   ofloxacin (OCUFLOX) 0.3 % ophthalmic solution [id number]  Place 1 drop into the left eye 4 (four) times daily. X 5 days Reine Just  Active   UNABLE TO FIND [id number] No as needed. Teacher, early years/pre, Historical, MD Taking Active             Home Care and Equipment/Supplies: Were Home Health Services Ordered?: NA Any new  equipment or medical supplies ordered?: NA  Functional Questionnaire: Do you need assistance with bathing/showering or dressing?: No Do you need assistance with meal preparation?: No Do you need assistance with eating?: No Do you have difficulty maintaining continence: No Do you need assistance with getting out of bed/getting out of a chair/moving?: No Do you have difficulty managing or taking your medications?: No  Follow up appointments reviewed: PCP Follow-up appointment confirmed?: No (declined appt) Specialist Hospital Follow-up appointment confirmed?: NA Do you need transportation to your follow-up appointment?: No Do you understand care options if your condition(s) worsen?: Yes-patient verbalized understanding    SIGNATURE Karena Addison, LPN [facility] Nurse Health Advisor Direct Dial [phone]

## 2024-02-19 ENCOUNTER — Emergency Department (HOSPITAL_COMMUNITY)
Admission: EM | Admit: 2024-02-19 | Discharge: 2024-02-19 | Disposition: A | Discharge status: Home / Self Care | Payer: Self-pay | Attending: Emergency Medicine | Admitting: Emergency Medicine

## 2024-02-19 ENCOUNTER — Emergency Department (HOSPITAL_COMMUNITY): Payer: Self-pay

## 2024-02-19 ENCOUNTER — Other Ambulatory Visit: Payer: Self-pay

## 2024-02-19 DIAGNOSIS — R600 Localized edema: Secondary | ICD-10-CM | POA: Insufficient documentation

## 2024-02-19 LAB — PREGNANCY, URINE: Preg Test, Ur: NEGATIVE

## 2024-02-19 LAB — COMPREHENSIVE METABOLIC PANEL WITH GFR
ALT: 29 U/L (ref 0–44)
AST: 34 U/L (ref 15–41)
Albumin: 3.8 g/dL (ref 3.5–5.0)
Alkaline Phosphatase: 53 U/L (ref 38–126)
Anion gap: 10 (ref 5–15)
BUN: <5 mg/dL — ABNORMAL LOW (ref 6–20)
CO2: 26 mmol/L (ref 22–32)
Calcium: 9 mg/dL (ref 8.9–10.3)
Chloride: 104 mmol/L (ref 98–111)
Creatinine, Ser: 0.79 mg/dL (ref 0.44–1.00)
GFR, Estimated: >60 mL/min (ref >60)
Glucose, Bld: 74 mg/dL (ref 70–99)
Potassium: 3.4 mmol/L — ABNORMAL LOW (ref 3.5–5.1)
Sodium: 140 mmol/L (ref 135–145)
Total Bilirubin: 0.7 mg/dL (ref 0.0–1.2)
Total Protein: 6.8 g/dL (ref 6.5–8.1)

## 2024-02-19 LAB — URINALYSIS, ROUTINE W REFLEX MICROSCOPIC
Bilirubin Urine: NEGATIVE
Glucose, UA: NEGATIVE mg/dL
Hgb urine dipstick: NEGATIVE
Ketones, ur: NEGATIVE mg/dL
Nitrite: NEGATIVE
Protein, ur: NEGATIVE mg/dL
Specific Gravity, Urine: 1.014 (ref 1.005–1.030)
pH: 6 (ref 5.0–8.0)

## 2024-02-19 LAB — CBC
HCT: 41 % (ref 36.0–46.0)
Hemoglobin: 13.4 g/dL (ref 12.0–15.0)
MCH: 27.5 pg (ref 26.0–34.0)
MCHC: 32.7 g/dL (ref 30.0–36.0)
MCV: 84 fL (ref 80.0–100.0)
Platelets: 322 K/uL (ref 150–400)
RBC: 4.88 MIL/uL (ref 3.87–5.11)
RDW: 15.4 % (ref 11.5–15.5)
WBC: 6 K/uL (ref 4.0–10.5)
nRBC: 0 % (ref 0.0–0.2)

## 2024-02-19 LAB — LIPASE, BLOOD: Lipase: 55 U/L — ABNORMAL HIGH (ref 11–51)

## 2024-02-19 MED ORDER — FUROSEMIDE 20 MG PO TABS: 20.0000 mg | ORAL_TABLET | Freq: Every day | ORAL | 0 refills | Status: AC

## 2024-02-19 MED ORDER — POTASSIUM CHLORIDE CRYS ER 20 MEQ PO TBCR: 20.0000 meq | EXTENDED_RELEASE_TABLET | Freq: Every day | ORAL | 0 refills | Status: AC

## 2024-02-19 MED ORDER — ONDANSETRON HCL 4 MG/2ML IJ SOLN: 4.0000 mg | Freq: Once | INTRAMUSCULAR | Status: DC | PRN

## 2024-02-19 NOTE — Discharge Instructions (Signed)

## 2024-02-19 NOTE — ED Triage Notes (Signed)
 Pt c/o bilateral leg selling x 1 month and c/o nausea x 3 weeks. Pt decided to come in today because she is just tired of dealing with it.

## 2024-02-19 NOTE — ED Notes (Signed)
 Patient has minimal swelling in both legs, no redness, warmth. Patient was able to walk to bed without any distress. Patient complaining of sharp pain in right leg, and knee swelling in left. Patient also complaining of nausea.

## 2024-02-19 NOTE — ED Provider Notes (Signed)
 [place] EMERGENCY DEPARTMENT AT [facility] Provider Note   CSN: [id number] Arrival date & time: 02/19/24  9297     Patient presents with: Leg Swelling and Nausea   Terri Flowers is a 43 y.o. female.  {Add pertinent medical, surgical, social history, OB history to YEP:67052} Patient comes in with swelling to her lower legs.  She also complains of some pain   Weakness      Prior to Admission medications   Medication Sig Start Date End Date Taking? Authorizing Provider  cetirizine (ZYRTEC) 10 MG tablet Take 10 mg by mouth daily.   Yes [provider]  diphenhydrAMINE (BENADRYL) 25 mg capsule Take 50 mg by mouth every 6 (six) hours as needed for allergies.   Yes [provider]  furosemide  (LASIX ) 20 MG tablet Take 1 tablet (20 mg total) by mouth daily. 02/19/24  Yes Lucile Didonato, MD  potassium chloride  SA (KLOR-CON  M) 20 MEQ tablet Take 1 tablet (20 mEq total) by mouth daily. 02/19/24  Yes Iona Stay, MD  UNABLE TO FIND as needed. [facility]    [provider]    Allergies: Bee venom, Penicillins, and Pineapple    Review of Systems  Neurological:  Positive for weakness.    Updated Vital Signs BP 110/85   Pulse 70   Temp 98.6 F (37 C) (Oral)   Resp 15   Ht 5' 5 (1.651 m)   Wt 101.2 kg   LMP 02/13/2024 (Approximate)   SpO2 98%   BMI 37.11 kg/m   Physical Exam  (all labs ordered are listed, but only abnormal results are displayed) Labs Reviewed  LIPASE, BLOOD - Abnormal; Notable for the following components:      Result Value   Lipase 55 (*)    All other components within normal limits  COMPREHENSIVE METABOLIC PANEL WITH GFR - Abnormal; Notable for the following components:   Potassium 3.4 (*)    BUN <5 (*)    All other components within normal limits  URINALYSIS, ROUTINE W REFLEX MICROSCOPIC - Abnormal; Notable for the following components:   APPearance CLOUDY (*)    Leukocytes,Ua MODERATE (*)    Bacteria,  UA RARE (*)    All other components within normal limits  URINE CULTURE  CBC  PREGNANCY, URINE    EKG: None  Radiology: US  Venous Img Lower Bilateral Result Date: 02/19/2024 CLINICAL DATA:  BILATERAL calf pain and swelling, worse on the LEFT. EXAM: Bilateral Lower Extremity Venous Doppler Ultrasound TECHNIQUE: Gray-scale sonography with compression, as well as color and duplex ultrasound, were performed to evaluate the deep venous system(s) from the level of the common femoral vein through the popliteal and proximal calf veins. COMPARISON:  None available FINDINGS: VENOUS Normal compressibility of the common femoral, superficial femoral, and popliteal veins, as well as the visualized calf veins. Visualized portions of profunda femoral vein and great saphenous vein unremarkable. No filling defects to suggest DVT on grayscale or color Doppler imaging. Doppler waveforms show normal direction of venous flow, normal respiratory plasticity and response to augmentation. OTHER None. Limitations: none IMPRESSION: No lower extremity DVT. Electronically Signed   By: Aliene Lloyd M.D.   On: 02/19/2024 11:21    {Document cardiac monitor, telemetry assessment procedure when appropriate:32947} Procedures   Medications Ordered in the ED  ondansetron  (ZOFRAN ) injection 4 mg (has no administration in time range)      {Click here for ABCD2, HEART and other calculators REFRESH Note before signing:1}  Medical Decision Making Amount and/or Complexity of Data Reviewed Labs: ordered.  Risk Prescription drug management.   Patient with peripheral edema.  Ultrasound of the legs were unremarkable.  She will be placed on Lasix  and potassium and follow-up with a primary care doctor  {Document critical care time when appropriate  Document review of labs and clinical decision tools ie CHADS2VASC2, etc  Document your independent review of radiology images and any outside records   Document your discussion with family members, caretakers and with consultants  Document social determinants of health affecting pt's care  Document your decision making why or why not admission, treatments were needed:32947:::1}   Final diagnoses:  Peripheral edema    ED Discharge Orders          Ordered    furosemide  (LASIX ) 20 MG tablet  Daily        02/19/24 1325    potassium chloride  SA (KLOR-CON  M) 20 MEQ tablet  Daily        02/19/24 1325

## 2024-02-20 LAB — URINE CULTURE: Culture: <10000 — AB

## 2024-05-22 ENCOUNTER — Emergency Department (HOSPITAL_COMMUNITY)
Admission: EM | Admit: 2024-05-22 | Discharge: 2024-05-22 | Disposition: A | Discharge status: Home / Self Care | Payer: Self-pay | Source: Home / Self Care

## 2024-05-22 ENCOUNTER — Encounter (HOSPITAL_COMMUNITY): Payer: Self-pay

## 2024-05-22 ENCOUNTER — Ambulatory Visit (HOSPITAL_COMMUNITY)
Admission: EM | Admit: 2024-05-22 | Discharge: 2024-05-22 | Disposition: A | Discharge status: Home / Self Care | Payer: Self-pay | Attending: Nurse Practitioner | Admitting: Nurse Practitioner

## 2024-05-22 DIAGNOSIS — J3489 Other specified disorders of nose and nasal sinuses: Secondary | ICD-10-CM

## 2024-05-22 DIAGNOSIS — N644 Mastodynia: Secondary | ICD-10-CM

## 2024-05-22 MED ORDER — MUPIROCIN 2 % EX OINT: 1.0000 | TOPICAL_OINTMENT | Freq: Two times a day (BID) | CUTANEOUS | 0 refills | Status: DC

## 2024-05-22 NOTE — ED Triage Notes (Signed)
 Pt c/o rt shoulder pain with rt arm/hand numbness x2 days. States last night noticed a lump to side off rt breast. Denies taken any meds.

## 2024-05-22 NOTE — Discharge Instructions (Signed)
 Referral has been made for a new primary care provider so you can follow up for further evaluation of your breast pain.  May use warm compresses, tylenol , and ibuprofen  to help manage the discomfort. Mupirocin ointment has been sent to the pharmacy to help heal the lesion noted to the outer right part of your nose.

## 2024-05-22 NOTE — ED Provider Notes (Signed)
 [facility]    CSN: [id number] Arrival date & time: 05/22/24  1303      History   Chief Complaint Chief Complaint  Patient presents with   Arm Pain   lump in breast    HPI Terri Flowers is a 43 y.o. female.   1.  Patient presents for evaluation of right breast pain that has been ongoing for the past 3 days.  Patient states she has felt a painful lump in the lower right side of her breast.  Denies any nipple discharge or dimpling of the skin.  States has been a long time since she has had a breast exam.  Denies any previous concerns.  No systemic symptoms such as fever, body aches, or chills.  Patient states that she has had right upper extremity discomfort which has caused difficulty sleeping.  She tried adjusting herself on a pillow under her arm and above her head due to discomfort but that only caused her to be sore on both upper extremities.  No reported trauma.  Patient states that at baseline she does have bilateral upper extremity discomfort and hand tingling/numbness.  However, it seems to be more pronounced over the past 3 days.  Patient's last menstrual cycle ended 4 days ago.  Denies any previous knowledge of fibrocystic breasts.  Patient endorses anxiety over this because her mother died from breast/brain cancer. 2.  Patient also states that she noticed a small skin sore to the lower outer right nare upon waking up this morning.  No other symptomology.  No reported injury?  Patient is a CNA.  The history is provided by the patient.  Arm Pain Pertinent negatives include no chest pain, no abdominal pain and no shortness of breath.    Past Medical History:  Diagnosis Date   Allergy    Asthma    Cervical cancer (HCC) 2005   Depression    Disorder of right sciatic nerve    Sleeping difficulty     Patient Active Problem List   Diagnosis Date Noted   Irregular bleeding 09/05/2021   Pelvic pain 09/05/2021   Pregnancy examination or test, negative result  09/05/2021   Routine cervical smear 09/05/2021   History of abnormal cervical Pap smear 09/05/2021   Anxiety and depression 09/05/2021   Smoker 09/05/2021   Encounter for initial prescription of contraceptive pills 09/05/2021   Anxiety 08/20/2019   Moderate episode of recurrent major depressive disorder (HCC) 08/20/2019   Primary insomnia 08/20/2019   Thyroid  disease 08/20/2019   Sciatica 07/04/2017    Past Surgical History:  Procedure Laterality Date   NO PAST SURGERIES      OB History     Gravida  1   Para  1   Term  1   Preterm      AB      Living  1      SAB      IAB      Ectopic      Multiple      Live Births  1            Home Medications    Prior to Admission medications   Medication Sig Start Date End Date Taking? Authorizing Provider  mupirocin ointment (BACTROBAN) 2 % Apply 1 Application topically 2 (two) times daily for 7 days. 05/22/24 05/29/24 Yes Janet Therisa PARAS, FNP  cetirizine (ZYRTEC) 10 MG tablet Take 10 mg by mouth daily.    [provider]  diphenhydrAMINE (BENADRYL)  25 mg capsule Take 50 mg by mouth every 6 (six) hours as needed for allergies.    [provider]  furosemide  (LASIX ) 20 MG tablet Take 1 tablet (20 mg total) by mouth daily. 02/19/24   Zammit, Joseph, MD  potassium chloride  SA (KLOR-CON  M) 20 MEQ tablet Take 1 tablet (20 mEq total) by mouth daily. 02/19/24   Zammit, Joseph, MD  UNABLE TO FIND as needed. [facility]    [provider]    Family History Family History  Problem Relation Age of Onset   Alzheimer's disease Maternal Grandmother    Hypertension Father    Hyperlipidemia Father    Cancer Mother        breast and brain   Depression Mother    Hyperlipidemia Mother    Hypertension Mother    Dementia Mother    Migraines Mother    Multiple sclerosis Mother    Depression Sister    Hypertension Daughter    Other Daughter        on med for heart and liver    Social  History Social History   Tobacco Use   Smoking status: Every Day    Current packs/day: 0.00    Average packs/day: 1 pack/day for 20.0 years (20.0 ttl pk-yrs)    Types: Cigarettes    Start date: 04/1997    Last attempt to quit: 04/2017    Years since quitting: 7.1   Smokeless tobacco: Never  Vaping Use   Vaping status: Former  Substance Use Topics   Alcohol use: Yes    Comment: every now and then   Drug use: No     Allergies   Bee venom, Penicillins, and Pineapple   Review of Systems Review of Systems  Constitutional:  Negative for appetite change, fatigue and fever.  HENT:  Negative for congestion, rhinorrhea and sore throat.        Positive for right nasal sore.  Eyes:  Negative for pain and redness.  Respiratory:  Negative for cough and shortness of breath.   Cardiovascular:  Negative for chest pain and palpitations.  Gastrointestinal:  Negative for abdominal pain, diarrhea and vomiting.  Genitourinary:  Negative for dysuria and urgency.  Musculoskeletal:  Positive for arthralgias and myalgias. Negative for neck pain.  Skin:  Negative for rash and wound.  Psychiatric/Behavioral:  Positive for sleep disturbance. The patient is nervous/anxious.      Physical Exam Triage Vital Signs ED Triage Vitals  Encounter Vitals Group     BP 05/22/24 1504 106/70     Girls Systolic BP Percentile --      Girls Diastolic BP Percentile --      Boys Systolic BP Percentile --      Boys Diastolic BP Percentile --      Pulse Rate 05/22/24 1504 86     Resp 05/22/24 1504 18     Temp 05/22/24 1504 98.1 F (36.7 C)     Temp Source 05/22/24 1504 Oral     SpO2 05/22/24 1504 97 %     Weight --      Height --      Head Circumference --      Peak Flow --      Pain Score 05/22/24 1501 0     Pain Loc --      Pain Education --      Exclude from Growth Chart --    No data found.  Updated Vital Signs BP 106/70 (BP  Location: Left Arm)   Pulse 86   Temp 98.1 F (36.7 C) (Oral)    Resp 18   LMP 05/13/2024 (Approximate)   SpO2 97%    Physical Exam Vitals and nursing note reviewed.  Constitutional:      Appearance: Normal appearance.  HENT:     Head: Normocephalic.  Neck:     Comments: No cervical bony spinal or bilateral paraspinal muscle tenderness.  Maintains range of motion with flexion, hyperextension, and rotation of head without difficulty. Cardiovascular:     Rate and Rhythm: Normal rate and regular rhythm.     Heart sounds: No murmur heard. Pulmonary:     Effort: Pulmonary effort is normal.     Breath sounds: Normal breath sounds. No wheezing, rhonchi or rales.  Chest:  Breasts:    Right: Tenderness present. No inverted nipple or nipple discharge.     Left: Tenderness present. No inverted nipple or nipple discharge.       Comments: No external skin changes noted such as erythema, warmth, rash, or lesions bilaterally.  Patient provided her verbal and implied consent for the exam.  Exam chaperoned by Diane Kane, RN. Abdominal:     General: Bowel sounds are normal.  Musculoskeletal:     Comments: Bilateral upper extremities:  Equal upper extremity strength, strong handgrips.  Equal shoulder shrug.  Neurovascular status intact distally.    Lymphadenopathy:     Upper Body:     Right upper body: No axillary adenopathy.     Left upper body: No axillary adenopathy.  Skin:    General: Skin is warm and dry.     Comments: Erythematous ulceration noted to the inferior aspect of her right external nare.  No external erythema, warmth, or swelling appreciated.  Neurological:     General: No focal deficit present.     Mental Status: She is alert and oriented to person, place, and time.  Psychiatric:        Mood and Affect: Mood normal.        Behavior: Behavior normal.        Thought Content: Thought content normal.        Judgment: Judgment normal.      UC Treatments / Results  Labs (all labs ordered are listed, but only abnormal results are  displayed) Labs Reviewed - No data to display  EKG   Radiology No results found.  Procedures Procedures (including critical care time)  Medications Ordered in UC Medications - No data to display  Initial Impression / Assessment and Plan / UC Course  I have reviewed the triage vital signs and the nursing notes.  Pertinent labs & imaging results that were available during my care of the patient were reviewed by me and considered in my medical decision making (see chart for details).    Patient presents for evaluation of a lump noted to her right breast.  This is in association with some right upper extremity discomfort that is now located in her left upper extremity.  Patient endorses ongoing hand numbness and tingling at baseline.  On physical exam, she does have dense fibrous tender breast tissue in both of her breasts.  No nipple discharge.  No axillary lymphadenopathy noted bilaterally.  Patient was connected today with a primary care provider on May 27, 2024 for ongoing evaluation and management.  Reassurance provided to the patient-she is anxious since her mother died from breast/brain cancer.  No acute neurological deficits were noted to her upper extremities  bilaterally.  We further evaluated by her primary care provider as well. For the nasal lesion, a prescription for mupirocin was provided.  Close observation for any new or worsening symptoms. Final Clinical Impressions(s) / UC Diagnoses   Final diagnoses:  Acute breast pain  Nasal lesion     Discharge Instructions      Referral has been made for a new primary care provider so you can follow up for further evaluation of your breast pain.  May use warm compresses, tylenol , and ibuprofen  to help manage the discomfort. Mupirocin ointment has been sent to the pharmacy to help heal the lesion noted to the outer right part of your nose.       ED Prescriptions     Medication Sig Dispense Auth. Provider   mupirocin  ointment (BACTROBAN) 2 % Apply 1 Application topically 2 (two) times daily for 7 days. 30 g Janet Therisa PARAS, FNP      PDMP not reviewed this encounter.   Janet Therisa PARAS, FNP 05/22/24 1610

## 2024-05-26 NOTE — Progress Notes (Signed)
 Subjective:  Patient ID: Terri Flowers, female    DOB: [birth year], 43 y.o.   MRN: [id number]  Patient Care Team: Deitra Morton Hummer, Nena, NP as PCP - General (Nurse Practitioner)   Chief Complaint:  Establish Care, Breast Mass (Lump on breast was told it was a cyst but does not believe them), Numbness (Numbness is both arms and hands), and Menstrual Problem (Having painful cramps with cycle )   HPI: Terri Flowers is a 43 y.o. female presenting on 05/27/2024 for Establish Care, Breast Mass (Lump on breast was told it was a cyst but does not believe them), Numbness (Numbness is both arms and hands), and Menstrual Problem (Having painful cramps with cycle )   Discussed the use of AI scribe software for clinical note transcription with the patient, who gave verbal consent to proceed.  History of Present Illness Terri Flowers is a 43 year old female who presents with numbness and pain in her arms and hands.  She has been experiencing numbness in her arms and hands for about a month, occurring both during sleep and while awake. The numbness is atypical, taking one to two hours for sensation to return, and is accompanied by excruciating pain. These symptoms affect both arms and hands daily, including at night and upon waking.  She has a history of cervical cancer diagnosed at age 32-25, treated by lancing the cervix. She experiences menorrhagia, with her menstrual cycle occurring twice a month. Her last Pap smear was approximately four to five years ago.  She has noticed a lump in her right breast at the six o'clock position. She previously had a cyst in her breast about ten years ago, which was aspirated and found to be non-cancerous. Her mother had a history of breast cancer.  She has a history of smoking one pack of cigarettes daily for about 20 years. She denies any shortness of breath or tiredness with activity. She does not consume alcohol or use illegal drugs.  She has a history of  thyroid  disease but has never been on medication for it. She was previously on Lasix  for ankle swelling but stopped taking it. She also has a history of anxiety and depression but has not been on medication for these conditions.       05/27/2024    3:52 PM 09/05/2021    2:40 PM 08/16/2018    8:23 AM  PHQ9 SCORE ONLY  PHQ-9 Total Score 9 15  0      Data saved with a previous flowsheet row definition       05/27/2024    3:53 PM 09/05/2021    2:41 PM  GAD 7 : Generalized Anxiety Score  Nervous, Anxious, on Edge 0 2  Control/stop worrying 2 3  Worry too much - different things 0 3  Trouble relaxing 0 2  Restless 0 3  Easily annoyed or irritable 0 3  Afraid - awful might happen 0 3  Total GAD 7 Score 2 19  Anxiety Difficulty Not difficult at all       Relevant past medical, surgical, family, and social history reviewed and updated as indicated.  Allergies and medications reviewed and updated. Data reviewed: Chart in Epic.   Past Medical History:  Diagnosis Date   Allergy    Asthma    Cervical cancer (HCC) 2005   Depression    Disorder of right sciatic nerve    Sleeping difficulty     Past Surgical History:  Procedure Laterality  Date   NO PAST SURGERIES      Social History   Socioeconomic History   Marital status: Divorced    Spouse name: Not on file   Number of children: 1   Years of education: Not on file   Highest education level: Some college, no degree  Occupational History   Occupation: unemployed  Tobacco Use   Smoking status: Every Day    Current packs/day: 0.00    Average packs/day: 1 pack/day for 20.0 years (20.0 ttl pk-yrs)    Types: Cigarettes    Start date: 04/1997    Last attempt to quit: 04/2017    Years since quitting: 7.1   Smokeless tobacco: Never  Vaping Use   Vaping status: Former  Substance and Sexual Activity   Alcohol use: Yes    Comment: every now and then   Drug use: No   Sexual activity: Yes    Birth control/protection:  None  Other Topics Concern   Not on file  Social History Narrative   Lives at home with daughter and boyfriend   Drinks about a 2 liter's worth of caffeine daily   Right handed   Social Drivers of Health   Financial Resource Strain: Low Risk  (09/05/2021)   Overall Financial Resource Strain (CARDIA)    Difficulty of Paying Living Expenses: Not hard at all  Food Insecurity: No Food Insecurity (09/05/2021)   Hunger Vital Sign    Worried About Running Out of Food in the Last Year: Never true    Ran Out of Food in the Last Year: Never true  Transportation Needs: No Transportation Needs (09/05/2021)   PRAPARE - Administrator, Civil Service (Medical): No    Lack of Transportation (Non-Medical): No  Physical Activity: Inactive (09/05/2021)   Exercise Vital Sign    Days of Exercise per Week: 0 days    Minutes of Exercise per Session: 0 min  Stress: Stress Concern Present (09/05/2021)   [employer] of Occupational Health - Occupational Stress Questionnaire    Feeling of Stress : Very much  Social Connections: Unknown (11/29/2021)   Received from [facility]   Social Network    Social Network: Not on file  Recent Concern: Social Connections - Socially Isolated (09/05/2021)   Social Connection and Isolation Panel    Frequency of Communication with Friends and Family: More than three times a week    Frequency of Social Gatherings with Friends and Family: Three times a week    Attends Religious Services: Never    Active Member of Clubs or Organizations: No    Attends Banker Meetings: Never    Marital Status: Divorced  Catering Manager Violence: Unknown (10/21/2021)   Received from [facility]   HITS    Physically Hurt: Not on file    Insult or Talk Down To: Not on file    Threaten Physical Harm: Not on file    Scream or Curse: Not on file    Outpatient Encounter Medications as of 05/27/2024  Medication Sig   DULoxetine (CYMBALTA) 20 MG capsule Take 1  capsule (20 mg total) by mouth daily.   furosemide  (LASIX ) 20 MG tablet Take 1 tablet (20 mg total) by mouth daily. (Patient not taking: Reported on 05/27/2024)   potassium chloride  SA (KLOR-CON  M) 20 MEQ tablet Take 1 tablet (20 mEq total) by mouth daily. (Patient not taking: Reported on 05/27/2024)   [DISCONTINUED] cetirizine (ZYRTEC) 10 MG tablet Take 10 mg by mouth  daily. (Patient not taking: Reported on 05/27/2024)   [DISCONTINUED] diphenhydrAMINE (BENADRYL) 25 mg capsule Take 50 mg by mouth every 6 (six) hours as needed for allergies. (Patient not taking: Reported on 05/27/2024)   [DISCONTINUED] mupirocin ointment (BACTROBAN) 2 % Apply 1 Application topically 2 (two) times daily for 7 days. (Patient not taking: Reported on 05/27/2024)   [DISCONTINUED] UNABLE TO FIND as needed. Bayer Back & Body (Patient not taking: Reported on 05/27/2024)   No facility-administered encounter medications on file as of 05/27/2024.    Allergies  Allergen Reactions   Bee Venom Swelling   Penicillins     Face swells.   Pineapple     Review of Systems  Constitutional:  Negative for chills and fever.  HENT:  Negative for ear pain and sore throat.   Respiratory:  Negative for cough.   Cardiovascular:  Negative for chest pain and leg swelling.  Gastrointestinal:  Negative for constipation, diarrhea, nausea and vomiting.  Musculoskeletal:  Negative for falls.  Skin:  Negative for itching and rash.  Neurological:  Positive for tingling. Negative for dizziness and headaches.       Bilateral extremities  Psychiatric/Behavioral:  Negative for depression and substance abuse. The patient is nervous/anxious.          Objective:  BP 102/73   Pulse 74   Temp (!) 97.4 F (36.3 C) (Temporal)   Ht 5' 5 (1.651 m)   Wt 228 lb (103.4 kg)   LMP 05/13/2024 (Approximate)   SpO2 99%   BMI 37.94 kg/m    Wt Readings from Last 3 Encounters:  05/27/24 228 lb (103.4 kg)  02/19/24 223 lb (101.2 kg)  09/05/21 213  lb 8 oz (96.8 kg)   BP Readings from Last 3 Encounters:  05/27/24 102/73  05/22/24 106/70  02/19/24 (!) 128/92    Physical Exam Vitals and nursing note reviewed.  Constitutional:      Appearance: She is obese.  HENT:     Head: Normocephalic and atraumatic.     Right Ear: Tympanic membrane, ear canal and external ear normal. There is no impacted cerumen.     Left Ear: Tympanic membrane, ear canal and external ear normal. There is no impacted cerumen.     Nose: Nose normal.     Mouth/Throat:     Mouth: Mucous membranes are moist.  Eyes:     Extraocular Movements: Extraocular movements intact.     Conjunctiva/sclera: Conjunctivae normal.     Pupils: Pupils are equal, round, and reactive to light.  Neck:     Vascular: No carotid bruit.  Cardiovascular:     Heart sounds: Normal heart sounds.  Pulmonary:     Effort: Pulmonary effort is normal.     Breath sounds: Normal breath sounds.  Abdominal:     General: Bowel sounds are normal.     Palpations: Abdomen is soft.  Musculoskeletal:        General: Normal range of motion.     Cervical back: Normal range of motion and neck supple. No rigidity or tenderness.     Right lower leg: No edema.     Left lower leg: No edema.  Lymphadenopathy:     Cervical: No cervical adenopathy.  Skin:    General: Skin is warm and dry.     Findings: No lesion or rash.  Neurological:     Mental Status: She is alert and oriented to person, place, and time.  Psychiatric:  Mood and Affect: Mood normal.        Behavior: Behavior normal.        Thought Content: Thought content normal.        Judgment: Judgment normal.    Physical Exam      Results for orders placed or performed during the hospital encounter of 02/19/24  Urine Culture   Collection Time: 02/19/24  7:30 AM   Specimen: Urine, Clean Catch  Result Value Ref Range   Specimen Description      URINE, CLEAN CATCH Performed at [facility], [address], [place], [place]  [address]    Special Requests      NONE Performed at [facility], [address], [place], [place] [address]    Culture (A)     <10,000 COLONIES/mL INSIGNIFICANT GROWTH Performed at [facility], [address], [place], [place] [address]    Report Status 02/20/2024 FINAL   Pregnancy, urine   Collection Time: 02/19/24  7:30 AM  Result Value Ref Range   Preg Test, Ur NEGATIVE NEGATIVE  Urinalysis, Routine w reflex microscopic -Urine, Clean Catch   Collection Time: 02/19/24  7:34 AM  Result Value Ref Range   Color, Urine YELLOW YELLOW   APPearance CLOUDY (A) CLEAR   Specific Gravity, Urine 1.014 1.005 - 1.030   pH 6.0 5.0 - 8.0   Glucose, UA NEGATIVE NEGATIVE mg/dL   Hgb urine dipstick NEGATIVE NEGATIVE   Bilirubin Urine NEGATIVE NEGATIVE   Ketones, ur NEGATIVE NEGATIVE mg/dL   Protein, ur NEGATIVE NEGATIVE mg/dL   Nitrite NEGATIVE NEGATIVE   Leukocytes,Ua MODERATE (A) NEGATIVE   RBC / HPF 0-5 0 - 5 RBC/hpf   WBC, UA 11-20 0 - 5 WBC/hpf   Bacteria, UA RARE (A) NONE SEEN   Squamous Epithelial / HPF 21-50 0 - 5 /HPF   Mucus PRESENT   Lipase, blood   Collection Time: 02/19/24  7:35 AM  Result Value Ref Range   Lipase 55 (H) 11 - 51 U/L  Comprehensive metabolic panel   Collection Time: 02/19/24  7:35 AM  Result Value Ref Range   Sodium 140 135 - 145 mmol/L   Potassium 3.4 (L) 3.5 - 5.1 mmol/L   Chloride 104 98 - 111 mmol/L   CO2 26 22 - 32 mmol/L   Glucose, Bld 74 70 - 99 mg/dL   BUN <5 (L) 6 - 20 mg/dL   Creatinine, Ser 9.20 0.44 - 1.00 mg/dL   Calcium 9.0 8.9 - 89.6 mg/dL   Total Protein 6.8 6.5 - 8.1 g/dL   Albumin 3.8 3.5 - 5.0 g/dL   AST 34 15 - 41 U/L   ALT 29 0 - 44 U/L   Alkaline Phosphatase 53 38 - 126 U/L   Total Bilirubin 0.7 0.0 - 1.2 mg/dL   GFR, Estimated >39 >39 mL/min   Anion gap 10 5 - 15  CBC   Collection Time: 02/19/24  7:35 AM  Result Value Ref Range   WBC 6.0 4.0 - 10.5 K/uL   RBC 4.88 3.87 - 5.11 MIL/uL   Hemoglobin 13.4 12.0 - 15.0 g/dL    HCT 58.9 63.9 - 53.9 %   MCV 84.0 80.0 - 100.0 fL   MCH 27.5 26.0 - 34.0 pg   MCHC 32.7 30.0 - 36.0 g/dL   RDW 84.5 88.4 - 84.4 %   Platelets 322 150 - 400 K/uL   nRBC 0.0 0.0 - 0.2 %  Pertinent labs & imaging results that were available during my care of the patient were reviewed by me and considered in my medical decision making.  Assessment & Plan:  Terri Flowers was seen today for establish care, breast mass, numbness and menstrual problem.  Diagnoses and all orders for this visit:  Smokes less than 1 pack a day with greater than 15 pack year history  Tobacco dependence  Class 2 obesity due to excess calories without serious comorbidity with body mass index (BMI) of 37.0 to 37.9 in adult -     Lipid panel -     Thyroid  Panel With TSH -     Bayer DCA Hb A1c Waived  Moderate episode of recurrent major depressive disorder (HCC) -     DULoxetine (CYMBALTA) 20 MG capsule; Take 1 capsule (20 mg total) by mouth daily.  Hx of cervical cancer -     Ambulatory referral to Obstetrics / Gynecology  Metrorrhagia -     Ambulatory referral to Obstetrics / Gynecology  Screening mammogram for breast cancer -     MM 3D DIAGNOSTIC MAMMOGRAM BILATERAL BREAST  Family history of breast cancer in mother -     MM 3D DIAGNOSTIC MAMMOGRAM BILATERAL BREAST  Encounter for general adult medical examination with abnormal findings -     CBC with Differential/Platelet -     Comprehensive metabolic panel with GFR -     Lipid panel -     Thyroid  Panel With TSH -     DULoxetine (CYMBALTA) 20 MG capsule; Take 1 capsule (20 mg total) by mouth daily. -     Ambulatory referral to Obstetrics / Gynecology -     MM 3D DIAGNOSTIC MAMMOGRAM BILATERAL BREAST -     Bayer DCA Hb A1c Waived  Breast lump on right side at 6 o'clock position -     MM 3D DIAGNOSTIC MAMMOGRAM BILATERAL BREAST  Thyroid  disease -     Thyroid  Panel With TSH     Assessment and Plan Patient is a 43 year old African-American  female seen today to establish care for chronic disease management, no acute distress Assessment & Plan Adult Wellness Visit Routine adult wellness visit with multiple ongoing health concerns. - Ordered lab tests: thyroid  function, A1c, lipid panel, kidney function. - Scheduled follow-up in six weeks.  Bilateral upper extremity numbness and neuropathic pain Bilateral upper extremity numbness and neuropathic pain for about a month. Cymbalta chosen for dual benefit in managing nerve pain and depression. - Prescribed Cymbalta 20 mg daily. - Advised to avoid sleeping on arms.  Depression Depression, currently untreated. Cymbalta prescribed for dual benefit in managing depression and neuropathic pain. - Prescribed Cymbalta 20 mg daily.  Nicotine dependence, cigarettes Nicotine dependence with a 20-year history of smoking one pack per day.  Excessive and frequent menstruation with irregular cycle and history of malignant neoplasm of cervix uteri Excessive and frequent menstruation with irregular cycle. History of cervical cancer treated with cervical lancing. Referral to gynecology planned for further evaluation. - Referred to gynecology for further evaluation.  Right breast lump with family history of malignant neoplasm of breast Right breast lump at the six o'clock area. Previous mammogram showed a cyst. Family history of breast cancer. Diagnostic mammogram planned due to palpable lump. - Ordered diagnostic mammogram.  Disorder of thyroid , unspecified Thyroid  disorder with previously elevated thyroid  levels. Lab tests ordered to assess current thyroid  function. - Ordered thyroid  function tests.   Labs: CBC, CMP, lipid, TSH, A1c result pending  Continue  all other maintenance medications.  Follow up plan: Return in about 6 weeks (around 07/08/2024) for Medication dose titration.   Continue healthy lifestyle choices, including diet (rich in fruits, vegetables, and lean proteins, and  low in salt and simple carbohydrates) and exercise (at least 30 minutes of moderate physical activity daily).  Educational handout given for    Clinical References  BMI for Adults Body mass index (BMI) is a number found using a person's weight and height. BMI can help tell how much of a person's weight is made up of fat. BMI does not measure body fat directly. It is used instead of tests that directly measure body fat, which can be difficult and expensive. What are BMI measurements used for? BMI is useful to: Find out if your weight puts you at higher risk for medical problems. Help recommend changes, such as in diet and exercise. This can help you reach a healthy weight. BMI screening can be done again to see if these changes are working. How is BMI calculated? Your height and weight are measured. The BMI is found from those numbers. This can be done with U.S[place] or metric measurements. Note that charts and online BMI calculators are available to help you find your BMI quickly and easily without doing these calculations. To calculate your BMI in U.S. measurements: Measure your weight in pounds (lb). Multiply the number of pounds by 703. So, for an adult who weighs 150 lb, multiply that number by 703: 150 x 703, which equals 105,450. Measure your height in inches. Then multiply that number by itself to get a measurement called inches squared. So, for an adult who is 70 inches tall, the inches squared measurement is 70 inches x 70 inches, which equals 4,900 inches squared. Divide the total from step 2 (number of lb x 703) by the total from step 3 (inches squared): 105,450  4,900 = 21.5. This is your BMI. To calculate your BMI in metric measurements:  Measure your weight in kilograms (kg). For this example, the weight is 70 kg. Measure your height in meters (m). Then multiply that number by itself to get a measurement called meters squared. So, for an adult who is 1.75 m tall, the meters  squared measurement is 1.75 m x 1.75 m, which equals 3.1 meters squared. Divide the number of kilograms (your weight) by the meters squared number. In this example: 70  3.1 = 22.6. This is your BMI. What do the results mean? BMI charts are used to see if you are underweight, normal weight, overweight, or obese. The following guidelines will be used: Underweight: BMI less than 18.5. Normal weight: BMI between 18.5 and 24.9. Overweight: BMI between 25 and 29.9. Obese: BMI of 30 or above. BMI is a tool and cannot diagnose a condition. Talk with your health care provider about what your BMI means for you. Keep these notes in mind: Weight includes fat and muscle. Someone with a muscular build, such as an athlete, may have a BMI that is higher than 24.9. In cases like these, BMI is not a correct measure of body fat. If you have a BMI of 25 or higher, your provider may need to do more testing to find out if excess body fat is the cause. BMI is measured the same way for males and females. Females usually have more body fat than males of the same height and weight. Where to find more information For more information about BMI, including tools to quickly find your  BMI, go to: Centers for Disease Control and Prevention: tonerpromos.no American Heart Association: heart.org National Heart, Lung, and Blood Institute: buffalodrycleaner.gl This information is not intended to replace advice given to you by your health care provider. Make sure you discuss any questions you have with your health care provider. Document Revised: 03/23/2022 Document Reviewed: 03/16/2022 Elsevier Patient Education  2024 Elsevier Inc. Obesity, Adult Obesity is the condition of having too much total body fat. Being overweight or obese means that your weight is greater than what is considered healthy for your body size. Obesity is determined by a measurement called BMI (body mass index). BMI is an estimate of body fat and is calculated from height  and weight. For adults, a BMI of 30 or higher is considered obese. Obesity can lead to other health concerns and major illnesses, including: Stroke. Coronary artery disease (CAD). Type 2 diabetes. Some types of cancer, including cancers of the colon, breast, uterus, and gallbladder. High blood pressure (hypertension). High cholesterol. Gallbladder stones. Obesity can also contribute to: Osteoarthritis. Sleep apnea. Infertility problems. What are the causes? Common causes of this condition include: Eating daily meals that are high in calories, sugar, and fat. Drinking high amounts of sugar-sweetened beverages, such as soft drinks. Being born with genes that may make you more likely to become obese. Having a medical condition that causes obesity, including: Hypothyroidism. Polycystic ovarian syndrome (PCOS). Binge-eating disorder. Cushing syndrome. Taking certain medicines, such as steroids, antidepressants, and seizure medicines. Not being physically active (sedentary lifestyle). Not getting enough sleep. What increases the risk? The following factors may make you more likely to develop this condition: Having a family history of obesity. Living in an area with limited access to: [place], recreation centers, or sidewalks. Healthy food choices, such as grocery stores and farmers' markets. What are the signs or symptoms? The main sign of this condition is having too much body fat. How is this diagnosed? This condition is diagnosed based on: Your BMI. If you are an adult with a BMI of 30 or higher, you are considered obese. Your waist circumference. This measures the distance around your waistline. Your skinfold thickness. Your health care provider may gently pinch a fold of your skin and measure it. You may have other tests to check for underlying conditions. How is this treated? Treatment for this condition often includes changing your lifestyle. Treatment may include some or all  of the following: Dietary changes. This may include developing a healthy meal plan. Regular physical activity. This may include activity that causes your heart to beat faster (aerobic exercise) and strength training. Work with your health care provider to design an exercise program that works for you. Medicine to help you lose weight if you are unable to lose one pound a week after six weeks of healthy eating and more physical activity. Treating conditions that cause the obesity (underlying conditions). Surgery. Surgical options may include gastric banding and gastric bypass. Surgery may be done if: Other treatments have not helped to improve your condition. You have a BMI of 40 or higher. You have life-threatening health problems related to obesity. Follow these instructions at home: Eating and drinking  Follow recommendations from your health care provider about what you eat and drink. Your health care provider may advise you to: Limit fast food, sweets, and processed snack foods. Choose low-fat options, such as low-fat milk instead of whole milk. Eat five or more servings of fruits or vegetables every day. Choose healthy foods when you eat out.  Keep low-fat snacks available. Limit sugary drinks, such as soda, fruit juice, sweetened iced tea, and flavored milk. Drink enough water to keep your urine pale yellow. Do not follow a fad diet. Fad diets can be unhealthy and even dangerous. Other healthful choices include: Eat at home more often. This gives you more control over what you eat. Learn to read food labels. This will help you understand how much food is considered one serving. Learn what a healthy serving size is. Physical activity Exercise regularly, as told by your health care provider. Most adults should get up to 150 minutes of moderate-intensity exercise every week. Ask your health care provider what types of exercise are safe for you and how often you should exercise. Warm up  and stretch before being active. Cool down and stretch after being active. Rest between periods of activity. Lifestyle Work with your health care provider and a dietitian to set a weight-loss goal that is healthy and reasonable for you. Limit your screen time. Find ways to reward yourself that do not involve food. Do not drink alcohol if: Your health care provider tells you not to drink. You are pregnant, may be pregnant, or are planning to become pregnant. If you drink alcohol: Limit how much you have to: 0-1 drink a day for women. 0-2 drinks a day for men. Know how much alcohol is in your drink. In the [place] one drink equals one 12 oz bottle of beer (355 mL), one 5 oz glass of wine (148 mL), or one 1 oz glass of hard liquor (44 mL). General instructions Keep a weight-loss journal to keep track of the food you eat and how much exercise you get. Take over-the-counter and prescription medicines only as told by your health care provider. Take vitamins and supplements only as told by your health care provider. Consider joining a support group. Your health care provider may be able to recommend a support group. Pay attention to your mental health as obesity can lead to depression or self esteem issues. Keep all follow-up visits. This is important. Contact a health care provider if: You are unable to meet your weight-loss goal after six weeks of dietary and lifestyle changes. You have trouble breathing. Summary Obesity is the condition of having too much total body fat. Being overweight or obese means that your weight is greater than what is considered healthy for your body size. Work with your health care provider and a dietitian to set a weight-loss goal that is healthy and reasonable for you. Exercise regularly, as told by your health care provider. Ask your health care provider what types of exercise are safe for you and how often you should exercise. This information is not intended to  replace advice given to you by your health care provider. Make sure you discuss any questions you have with your health care provider. Document Revised: 02/08/2021 Document Reviewed: 02/08/2021 Elsevier Patient Education  2024 [employer] Cancer Screening: Female A cancer screening is a test or exam that checks for cancer. Work with your health care provider to create a cancer screening schedule that protects your health. Who should have screening? All females should be considered for screening of certain cancers, including breast cancer, cervical cancer, colorectal cancer, endometrial cancer, lung cancer, and skin cancer. Your health care provider may recommend screenings for other types of cancer if: You have had cancer before. You have a family member with cancer. You have genes that could increase the risk of cancer. You have  risk factors for certain cancers, such as current or past use of tobacco products or being overweight. What are the benefits of screening? Cancer screening is done to look for cancer in the very early stages, before it spreads and becomes harder to treat and before you would start to notice symptoms. Finding cancer early improves the chances of successful treatment. It may save your life. When should I be screened for cancer? When you should be screened for cancer depends on: Your age. Your medical history and your family's medical history. Certain lifestyle factors, such as smoking or other use of tobacco products. Environmental exposure, such as to asbestos. How is screening done? Breast cancer Breast cancer screening is done with a test that takes images of breast tissue (mammogram) using an X-ray machine. Here are some screening guidelines for females at average risk: When you are 31-6 years old, you should be given the choice to start having mammograms. When you are 64-66 years old, you should have a mammogram every year. You may start having mammograms  before you are 42 years old if you have risk factors for breast cancer, such as having an immediate family member with breast cancer. At 49 years old or older, you should have a mammogram every 1-2 years for as long as you are in good health and have a life expectancy of 10 years or longer. It is important to know what your breasts look and feel like so you can report any changes to your health care provider.   Cervical cancer Cervical cancer screening is done with an HPV (human papillomavirus) test to identify the virus that causes cervical cancer. To perform the test, a health care provider takes a swab of cells from the lowest part of the uterus (cervix) during a pelvic exam. This test may be performed along with a Pap test. This testchecks for abnormalities in the cervix. All females at average risk should consider being screened for cervical cancer starting no later than 42 years old and continuing until 42 years old. Screening should not begin earlier than 42 years old. You will have tests every 3-5 years, depending on your results and the type of screening test. Talk with your health care provider about which screening test is right for you and how often you should be screened. If you have had the HPV vaccine, you will still be screened for cervical cancer and follow normal screening recommendations. You do not need to be screened for cervical cancer if any of the following apply to you: You are older than 42 years old and you have had normal screening tests in the past 10 years with no serious cervical precancer or cancer in the last 25 years. Your cervix and uterus have been removed, and you have never had cervical cancer or abnormal cells that could become cancer (precancerous cells). Colorectal cancer Colorectal cancer screening looks for cancer or for growths called polyps that often form before cancer starts. Tests to look for cancer or polyps include: Colonoscopy or flexible sigmoidoscopy.  For these procedures, a flexible tube with a small camera is inserted into the rectum. CT colonography. This test uses X-rays and contrast dye to check the colon for polyps. Tests to look for cancer in the stool (feces) include: Guaiac-based fecal occult blood test (FOBT). This test can find blood in stool. It can be done at home with a kit. Fecal immunochemical test (FIT). This test can find blood in stool. For this test, you will need  to collect stool samples at home. Stool DNA test. This test looks for blood in stool and any changes in DNA that can lead to colon cancer. For this test, you will need to collect a stool sample at home and send it to a lab. All adults should have screenings starting at 42 years old and continuing through 42 years old. For females 4-72 years old, the decision to be screened should be based on a person's preferences, life expectancy, overall health, and prior screening history. Your health care provider may recommend screening before 42 years old. You will have tests every 1-10 years, depending on your results and the type of screening test. People at increased risk should start screening at an earlier age. Talk with your health care provider about which screening test is right for you and how often you should be screened. Endometrial cancer There is no standard screening test for endometrial cancer, and females at average risk do not routinely need to have this screening. Talk with your health care provider about whether screening is right for you. If it is, this screening is performed through: Endometrial tissue biopsy. This tests a sample of tissue taken from the lining of the uterus. Vaginal ultrasound. If you are at increased risk for endometrial cancer, you may need to have these tests more often than normal. You are at increased risk if: You have a family history of ovarian, uterine, or certain types of colon cancer. You are taking tamoxifen, a medicine used to  treat breast cancer. If you have reached menopause, it is especially important to talk with your health care provider about any vaginal bleeding or spotting. Screening for endometrial cancer is not recommended for females who do not have symptoms of the cancer, such as vaginal bleeding. Lung cancer Lung cancer screening is done with a CT scan that looks for abnormal changes in the lungs. Discuss lung cancer screening with your health care provider if you are 46-72 years old and if any of the following apply to you: You currently smoke. You used to smoke heavily. You have a smoking history of 1 pack of cigarettes a day for 20 years or 2 packs a day for 10 years. You may need to be screened every year if you smoke heavily or if you used to smoke. Skin cancer Skin cancer screening is done by checking the skin for unusual moles or spots and any changes in existing moles. Your health care provider should check your skin for signs of skin cancer at every physical exam. You should check your skin every month and tell your health care provider right away if anything looks unusual. Females with a higher-than-normal risk for skin cancer may want to see a skin specialist (dermatologist) for an annual body check. Where to find more information American Cancer Society: cancer.org Centers for Disease Control and Prevention: tonerpromos.no National Cancer Institute: cancer.gov U.S. Department of Health and Human Services: travellesson.ca Contact a health care provider if: You have concerns about any signs or symptoms of cancer. These may include: Skin problems. You may have: Moles of an unusual shape or color. Changes in existing moles. A sore on your skin that does not heal. Tiredness (fatigue) that does not go away. Losing weight without trying. Blood in your urine or stool. Problems with coughing or breathing. These may include: Coughing or trouble breathing that does not go away. Coughing up blood. Lumps  or other changes in your breasts. Vaginal bleeding, spotting, or changes in your  period. Frequent pain or cramping in your abdomen. This information is not intended to replace advice given to you by your health care provider. Make sure you discuss any questions you have with your health care provider. Document Revised: 07/11/2022 Document Reviewed: 01/23/2022 Elsevier Patient Education  2024 Else[employer] Inc. Menstrual Periods: Self Care  Your period, also called a menstrual period, is the monthly shedding of the lining of the uterus. The uterus is the organ where a baby can grow if you become pregnant. Females usually start their periods between the ages of 36 and 76, but some people may be older or younger. People continue to have periods until they reach menopause. The menstrual cycle refers to a series of changes to your body to prepare for pregnancy. Each month, hormones cause the lining of your uterus to thicken. Each month that you don't get pregnant, your uterus sheds the lining and cleans itself out. This is your period. What can I expect during my period? Often, your period cycles about every 28 days if you don't get pregnant. The cycle length of each person's period varies. Your period can cycle every 21-45 days. During your period, blood, tissue, fluid, and mucus pass out of your vagina. Periods are different for each person. You may have: Bleeding that lasts for 3-7 days. Occasional heavy bleeding. Cramping, aching, and pain in: Your lower belly. Your lower back. Your breasts. Dizziness. Throwing up. Watery poop, also called diarrhea. Other physical or mood changes may happen several days before your menstrual period starts and go away a few days after bleeding begins. These symptoms are called premenstrual syndrome (PMS). Symptoms can include: Headache. Bloating. Tiredness. Food cravings. Trouble focusing. How to care for yourself during your period  Managing bleeding You  can manage bleeding using: A tampon. This is a packed cotton roll with a string attached for removal. You put the tampon into your vagina and take out the applicator when in place. Tampons absorb blood and other fluids. You must change your tampon every 4-8 hours. If your bleeding is heavy, you may need to change your tampon more often. You pull the string to remove it. A sanitary pad. These come in different sizes, scents, and with or without tabs called wings. Stick the pad to your underwear to absorb blood. You must change your pad at least every 4-8 hours, or whenever you're uncomfortable. Period underwear. This is underwear made with a material that absorbs blood. These can be washed like normal underwear. You may wear period underwear for a full day or change it more often depending on your flow. A menstrual cup. These are small rubber cups that you put inside your vagina. You must remove and empty the cup at least every 8-12 hours. Managing pain and discomfort To help relieve pain and discomfort during your period: Take a pain reliever you can buy at the store as told by your health care provider. Use a heating pad on your lower belly and back. Exercise regularly. Eat a healthy, well-balanced diet. A healthy diet includes: A lot of fruits and vegetables. Low-fat dairy products. Lean meats. Foods with fiber in them. Talk with your provider about the safety and benefits of taking supplements to relieve PMS symptoms. Ask your provider what foods and drinks to avoid.   How do I know if my period isn't normal? Signs that your period may not be normal include: Unusual bleeding or bleeding that isn't normal, including: Heavy bleeding. This means soaking through 1-2 sanitary  pads in 1 hour. Bleeding for more than 7 days. Bleeding between periods. Bleeding after you have sex. Cramps that are so painful you can't do your daily activities. Your menstrual cycle becoming irregular,  when it used to be regular. Follow these instructions at home: Keep track of your periods by using a calendar or phone app. Note the first day that your period starts and how many days you bleed. If you use tampons, use the least absorbent possible to avoid toxic shock syndrome (TSS). TSS is a serious health problem that can be caused by wearing a tampon for too long. Do not leave tampons in your vagina overnight or longer than 8 hours. Wear a sanitary pad or period underwear overnight. Contact a health care provider if: You have signs that your period may not be normal. You pass blood clots larger than the size of a quarter. You can't get relief for your symptoms from products you buy at the store. Get help right away if: Your period is so heavy that you have to change pads or tampons every 1-2 hours or more. You have any symptoms of TSS. Symptoms include: A fever. Throwing up or watery poop. Red skin that looks like a sunburn. Red eyes. Fainting or feeling dizzy. These symptoms may be an emergency. Call 911 right away. Do not wait to see if the symptoms will go away. Do not drive yourself to the hospital. This information is not intended to replace advice given to you by your health care provider. Make sure you discuss any questions you have with your health care provider. Document Revised: 04/22/2023 Document Reviewed: 04/22/2023 Elsevier Patient Education  2025 [employer]. Managing the Challenge of Quitting Smoking Quitting smoking is a physical and mental challenge. You may have cravings, withdrawal symptoms, and temptation to smoke. Before quitting, work with your health care provider to make a plan that can help you manage quitting. Making a plan before you quit may keep you from smoking when you have the urge to smoke while trying to quit. How to manage lifestyle changes Managing stress Stress can make you want to smoke, and wanting to smoke may cause stress. It is important to  find ways to manage your stress. You could try some of the following: Practice relaxation techniques. Breathe slowly and deeply, in through your nose and out through your mouth. Listen to music. Soak in a bath or take a shower. Imagine a peaceful place or vacation. Get some support. Talk with family or friends about your stress. Join a support group. Talk with a counselor or therapist. Get some physical activity. Go for a walk, run, or bike ride. Play a favorite sport. Practice yoga.   Medicines Talk with your health care provider about medicines that might help you deal with cravings and make quitting easier for you. Relationships Social situations can be difficult when you are quitting smoking. To manage this, you can: Avoid parties and other social situations where people might be smoking. Avoid alcohol. Leave right away if you have the urge to smoke. Explain to your family and friends that you are quitting smoking. Ask for support and let them know you might be a bit grumpy. Plan activities where smoking is not an option. General instructions Be aware that many people gain weight after they quit smoking. However, not everyone does. To keep from gaining weight, have a plan in place before you quit, and stick to the plan after you quit. Your plan should include: Eating  healthy snacks. When you have a craving, it may help to: Eat popcorn, or try carrots, celery, or other cut vegetables. Chew sugar-free gum. Changing how you eat. Eat small portion sizes at meals. Eat 4-6 small meals throughout the day instead of 1-2 large meals a day. Be mindful when you eat. You should avoid watching television or doing other things that might distract you as you eat. Exercising regularly. Make time to exercise each day. If you do not have time for a long workout, do short bouts of exercise for 5-10 minutes several times a day. Do some form of strengthening exercise, such as weight lifting. Do  some exercise that gets your heart beating and causes you to breathe deeply, such as walking fast, running, swimming, or biking. This is very important. Drinking plenty of water or other low-calorie or no-calorie drinks. Drink enough fluid to keep your urine pale yellow.   How to recognize withdrawal symptoms Your body and mind may experience discomfort as you try to get used to not having nicotine in your system. These effects are called withdrawal symptoms. They may include: Feeling hungrier than normal. Having trouble concentrating. Feeling irritable or restless. Having trouble sleeping. Feeling depressed. Craving a cigarette. These symptoms may surprise you, but they are normal to have when quitting smoking. To manage withdrawal symptoms: Avoid places, people, and activities that trigger your cravings. Remember why you want to quit. Get plenty of sleep. Avoid coffee and other drinks that contain caffeine. These may worsen some of your symptoms. How to manage cravings Come up with a plan for how to deal with your cravings. The plan should include the following: A definition of the specific situation you want to deal with. An activity or action you will take to replace smoking. A clear idea for how this action will help. The name of someone who could help you with this. Cravings usually last for 5-10 minutes. Consider taking the following actions to help you with your plan to deal with cravings: Keep your mouth busy. Chew sugar-free gum. Suck on hard candies or a straw. Brush your teeth. Keep your hands and body busy. Change to a different activity right away. Squeeze or play with a ball. Do an activity or a hobby, such as making bead jewelry, practicing needlepoint, or working with wood. Mix up your normal routine. Take a short exercise break. Go for a quick walk, or run up and down stairs. Focus on doing something kind or helpful for someone else. Call a friend or family member  to talk during a craving. Join a support group. Contact a quitline. Where to find support To get help or find a support group: Call the National Cancer Institute's Smoking Quitline: 1-800-QUIT-NOW (408)324-7601) Text QUIT to SmokefreeTXT: 521151 Where to find more information Visit these websites to find more information on quitting smoking: U.S. Department of Health and Human Services: www.smokefree.gov American Lung Association: www.freedomfromsmoking.org Centers for Disease Control and Prevention (CDC): footballexhibition.com.br American Heart Association: www.heart.org Contact a health care provider if: You want to change your plan for quitting. The medicines you are taking are not helping. Your eating feels out of control or you cannot sleep. You feel depressed or become very anxious. Summary Quitting smoking is a physical and mental challenge. You will face cravings, withdrawal symptoms, and temptation to smoke again. Preparation can help you as you go through these challenges. Try different techniques to manage stress, handle social situations, and prevent weight gain. You can deal with cravings  by keeping your mouth busy (such as by chewing gum), keeping your hands and body busy, calling family or friends, or contacting a quitline for people who want to quit smoking. You can deal with withdrawal symptoms by avoiding places where people smoke, getting plenty of rest, and avoiding drinks that contain caffeine. This information is not intended to replace advice given to you by your health care provider. Make sure you discuss any questions you have with your health care provider. Document Revised: 06/24/2021 Document Reviewed: 06/24/2021 Elsevier Patient Education  2024 Else[employer] Inc. Mindfulness-Based Stress Reduction: What to Know Mindfulness-based stress reduction (MBSR) is a mindfulness meditation program that normally takes place over 8 weeks. It usually includes weekly group classes and daily  exercises to do at home. What are the benefits of MBSR? Mindfulness meditation therapies, like MBSR, can change a person's brain and body in good ways, and make them healthier. MBSR can have many benefits, such as: Helping to lower stress hormones. Decreasing symptoms or helping to deal with symptoms of different conditions, like: Anxiety, which is feeling worried or nervous. Long-lasting pain. This is pain that lasts more than 3 months. Stress and worry. Trouble sleeping. Headaches, like migraines and tension headaches. Irritable bowel syndrome. Helping to handle stress from things you can't control, like: Long-term illnesses, especially if you have a lot of pain or other difficult symptoms. Big life events. Stress at work. Stress from taking care of someone else. Types of MBSR exercises Mindfulness. This is a common type of meditation. Meditation. It helps you focus your mind to feel calm and happy. It has two main parts: paying attention and accepting. Paying attention means focusing on what is happening right now. This usually means noticing your breathing, your thoughts, how your body feels, and your emotions. Accepting means noticing these feelings and sensations without judging them. Instead of reacting to these thoughts or feelings, you just observe them and let them pass. MSBR exercises include: Body scanning. This is a mindfulness exercise where you pay attention to how different parts of your body feel. You can do this while lying down or sitting up. Sitting meditations. In this exercise, you focus on something like your breathing. When your mind starts to wander, gently bring it back to your breath. Keep doing this every time you notice your mind wandering. Mindful movements. This exercise involves moving and stretching your body slowly while paying attention to how it feels. Mindful Tasks. This means paying attention to how your body feels while doing things like walking or  eating. Follow these instructions at home:  Find an in-person MBSR program or find a program that is online. Find a podcast or recording that provides guidance for MSBR exercises. Look for a therapist who knows how to use MBSR. Follow your treatment plan as told by your health care provider. This may include taking regular medicines and making changes to your diet or lifestyle. Where to find more information You can find more information about MBSR from: Your provider. Community-based meditation centers or programs. American Psychological Association at http://forbes-duran.com/. This information is not intended to replace advice given to you by your health care provider. Make sure you discuss any questions you have with your health care provider. Document Revised: 09/06/2023 Document Reviewed: 09/06/2023 Elsevier Patient Education  2025 [employer]. Preventive Care 59-47 Years Old, Female Preventive care refers to lifestyle choices and visits with your health care provider that can promote health and wellness. Preventive care visits are also called wellness  exams. What can I expect for my preventive care visit? Counseling Your health care provider may ask you questions about your: Medical history, including: Past medical problems. Family medical history. Pregnancy history. Current health, including: Menstrual cycle. Method of birth control. Emotional well-being. Home life and relationship well-being. Sexual activity and sexual health. Lifestyle, including: Alcohol, nicotine or tobacco, and drug use. Access to firearms. Diet, exercise, and sleep habits. Work and work astronomer. Sunscreen use. Safety issues such as seatbelt and bike helmet use. Physical exam Your health care provider will check your: Height and weight. These may be used to calculate your BMI (body mass index). BMI is a measurement that tells if you are at a healthy weight. Waist  circumference. This measures the distance around your waistline. This measurement also tells if you are at a healthy weight and may help predict your risk of certain diseases, such as type 2 diabetes and high blood pressure. Heart rate and blood pressure. Body temperature. Skin for abnormal spots. What immunizations do I need?  Vaccines are usually given at various ages, according to a schedule. Your health care provider will recommend vaccines for you based on your age, medical history, and lifestyle or other factors, such as travel or where you work. What tests do I need? Screening Your health care provider may recommend screening tests for certain conditions. This may include: Lipid and cholesterol levels. Diabetes screening. This is done by checking your blood sugar (glucose) after you have not eaten for a while (fasting). Pelvic exam and Pap test. Hepatitis B test. Hepatitis C test. HIV (human immunodeficiency virus) test. STI (sexually transmitted infection) testing, if you are at risk. Lung cancer screening. Colorectal cancer screening. Mammogram. Talk with your health care provider about when you should start having regular mammograms. This may depend on whether you have a family history of breast cancer. BRCA-related cancer screening. This may be done if you have a family history of breast, ovarian, tubal, or peritoneal cancers. Bone density scan. This is done to screen for osteoporosis. Talk with your health care provider about your test results, treatment options, and if necessary, the need for more tests. Follow these instructions at home: Eating and drinking  Eat a diet that includes fresh fruits and vegetables, whole grains, lean protein, and low-fat dairy products. Take vitamin and mineral supplements as recommended by your health care provider. Do not drink alcohol if: Your health care provider tells you not to drink. You are pregnant, may be pregnant, or are planning to  become pregnant. If you drink alcohol: Limit how much you have to 0-1 drink a day. Know how much alcohol is in your drink. In the [place] one drink equals one 12 oz bottle of beer (355 mL), one 5 oz glass of wine (148 mL), or one 1 oz glass of hard liquor (44 mL). Lifestyle Brush your teeth every morning and night with fluoride toothpaste. Floss one time each day. Exercise for at least 30 minutes 5 or more days each week. Do not use any products that contain nicotine or tobacco. These products include cigarettes, chewing tobacco, and vaping devices, such as e-cigarettes. If you need help quitting, ask your health care provider. Do not use drugs. If you are sexually active, practice safe sex. Use a condom or other form of protection to prevent STIs. If you do not wish to become pregnant, use a form of birth control. If you plan to become pregnant, see your health care provider for a prepregnancy visit.  Take aspirin only as told by your health care provider. Make sure that you understand how much to take and what form to take. Work with your health care provider to find out whether it is safe and beneficial for you to take aspirin daily. Find healthy ways to manage stress, such as: Meditation, yoga, or listening to music. Journaling. Talking to a trusted person. Spending time with friends and family. Minimize exposure to UV radiation to reduce your risk of skin cancer. Safety Always wear your seat belt while driving or riding in a vehicle. Do not drive: If you have been drinking alcohol. Do not ride with someone who has been drinking. When you are tired or distracted. While texting. If you have been using any mind-altering substances or drugs. Wear a helmet and other protective equipment during sports activities. If you have firearms in your house, make sure you follow all gun safety procedures. Seek help if you have been physically or sexually abused. What's next? Visit your health care  provider once a year for an annual wellness visit. Ask your health care provider how often you should have your eyes and teeth checked. Stay up to date on all vaccines. This information is not intended to replace advice given to you by your health care provider. Make sure you discuss any questions you have with your health care provider. Document Revised: 12/29/2020 Document Reviewed: 12/29/2020 [employer] Patient Education  2024 [employer]  The above assessment and management plan was discussed with the patient. The patient verbalized understanding of and has agreed to the management plan. Patient is aware to call the clinic if they develop any new symptoms or if symptoms persist or worsen. Patient is aware when to return to the clinic for a follow-up visit. Patient educated on when it is appropriate to go to the emergency department.    Jahir Halt [place] Thompson, DNP [facility] [address] [place], [place] [address] [phone]

## 2024-05-27 ENCOUNTER — Ambulatory Visit (INDEPENDENT_AMBULATORY_CARE_PROVIDER_SITE_OTHER): Payer: Self-pay | Admitting: Nurse Practitioner

## 2024-05-27 ENCOUNTER — Encounter: Payer: Self-pay | Admitting: Nurse Practitioner

## 2024-05-27 ENCOUNTER — Other Ambulatory Visit: Payer: Self-pay

## 2024-05-27 VITALS — BP 102/73 | HR 74 | Temp 97.4°F | Ht 65.0 in | Wt 228.0 lb

## 2024-05-27 DIAGNOSIS — E079 Disorder of thyroid, unspecified: Secondary | ICD-10-CM

## 2024-05-27 DIAGNOSIS — Z6837 Body mass index (BMI) 37.0-37.9, adult: Secondary | ICD-10-CM

## 2024-05-27 DIAGNOSIS — E6609 Other obesity due to excess calories: Secondary | ICD-10-CM | POA: Insufficient documentation

## 2024-05-27 DIAGNOSIS — N6315 Unspecified lump in the right breast, overlapping quadrants: Secondary | ICD-10-CM

## 2024-05-27 DIAGNOSIS — F331 Major depressive disorder, recurrent, moderate: Secondary | ICD-10-CM

## 2024-05-27 DIAGNOSIS — Z8541 Personal history of malignant neoplasm of cervix uteri: Secondary | ICD-10-CM | POA: Insufficient documentation

## 2024-05-27 DIAGNOSIS — Z0001 Encounter for general adult medical examination with abnormal findings: Secondary | ICD-10-CM | POA: Insufficient documentation

## 2024-05-27 DIAGNOSIS — F17200 Nicotine dependence, unspecified, uncomplicated: Secondary | ICD-10-CM

## 2024-05-27 DIAGNOSIS — E66812 Obesity, class 2: Secondary | ICD-10-CM

## 2024-05-27 DIAGNOSIS — Z1231 Encounter for screening mammogram for malignant neoplasm of breast: Secondary | ICD-10-CM | POA: Insufficient documentation

## 2024-05-27 DIAGNOSIS — F17210 Nicotine dependence, cigarettes, uncomplicated: Secondary | ICD-10-CM

## 2024-05-27 DIAGNOSIS — Z803 Family history of malignant neoplasm of breast: Secondary | ICD-10-CM

## 2024-05-27 DIAGNOSIS — N921 Excessive and frequent menstruation with irregular cycle: Secondary | ICD-10-CM

## 2024-05-27 LAB — LIPID PANEL

## 2024-05-27 MED ORDER — DULOXETINE HCL 20 MG PO CPEP: 20.0000 mg | ORAL_CAPSULE | Freq: Every day | ORAL | 1 refills | Status: DC

## 2024-05-28 LAB — COMPREHENSIVE METABOLIC PANEL WITH GFR
ALT: 28 IU/L (ref 0–32)
AST: 24 IU/L (ref 0–40)
Albumin: 4 g/dL (ref 3.9–4.9)
Alkaline Phosphatase: 63 IU/L (ref 41–116)
BUN/Creatinine Ratio: 8 — AB (ref 9–23)
BUN: 7 mg/dL (ref 6–24)
Bilirubin Total: <0.2 mg/dL (ref 0.0–1.2)
CO2: 21 mmol/L (ref 20–29)
Calcium: 9 mg/dL (ref 8.7–10.2)
Chloride: 105 mmol/L (ref 96–106)
Creatinine, Ser: 0.91 mg/dL (ref 0.57–1.00)
Globulin, Total: 1.9 g/dL (ref 1.5–4.5)
Glucose: 66 mg/dL — AB (ref 70–99)
Potassium: 3.9 mmol/L (ref 3.5–5.2)
Sodium: 141 mmol/L (ref 134–144)
Total Protein: 5.9 g/dL — AB (ref 6.0–8.5)
eGFR: 81 mL/min/1.73 (ref >59)

## 2024-05-28 LAB — CBC WITH DIFFERENTIAL/PLATELET
Basophils Absolute: 0.1 x10E3/uL (ref 0.0–0.2)
Basos: 1 %
EOS (ABSOLUTE): 0.1 x10E3/uL (ref 0.0–0.4)
Eos: 3 %
Hematocrit: 39.7 % (ref 34.0–46.6)
Hemoglobin: 12.7 g/dL (ref 11.1–15.9)
Immature Grans (Abs): 0 x10E3/uL (ref 0.0–0.1)
Immature Granulocytes: 0 %
Lymphocytes Absolute: 3 x10E3/uL (ref 0.7–3.1)
Lymphs: 56 %
MCH: 27 pg (ref 26.6–33.0)
MCHC: 32 g/dL (ref 31.5–35.7)
MCV: 85 fL (ref 79–97)
Monocytes Absolute: 0.6 x10E3/uL (ref 0.1–0.9)
Monocytes: 12 %
Neutrophils Absolute: 1.5 x10E3/uL (ref 1.4–7.0)
Neutrophils: 28 %
Platelets: 300 x10E3/uL (ref 150–450)
RBC: 4.7 x10E6/uL (ref 3.77–5.28)
RDW: 13.3 % (ref 11.7–15.4)
WBC: 5.2 x10E3/uL (ref 3.4–10.8)

## 2024-05-28 LAB — LIPID PANEL
Cholesterol, Total: 166 mg/dL (ref 100–199)
HDL: 37 mg/dL — AB (ref >39)
LDL CALC COMMENT:: 4.5 ratio — AB (ref 0.0–4.4)
LDL Chol Calc (NIH): 113 mg/dL — AB (ref 0–99)
Triglycerides: 82 mg/dL (ref 0–149)
VLDL Cholesterol Cal: 16 mg/dL (ref 5–40)

## 2024-05-28 LAB — THYROID PANEL WITH TSH
Free Thyroxine Index: 1.9 (ref 1.2–4.9)
T3 Uptake Ratio: 25 % (ref 24–39)
T4, Total: 7.6 ug/dL (ref 4.5–12.0)
TSH: 0.918 u[IU]/mL (ref 0.450–4.500)

## 2024-05-28 LAB — BAYER DCA HB A1C WAIVED: HB A1C (BAYER DCA - WAIVED): 5.3 % (ref 4.8–5.6)

## 2024-06-02 ENCOUNTER — Ambulatory Visit: Payer: Self-pay | Admitting: Nurse Practitioner

## 2024-06-03 ENCOUNTER — Other Ambulatory Visit (HOSPITAL_COMMUNITY): Payer: Self-pay | Admitting: Nurse Practitioner

## 2024-06-03 DIAGNOSIS — N63 Unspecified lump in unspecified breast: Secondary | ICD-10-CM

## 2024-07-01 ENCOUNTER — Ambulatory Visit (HOSPITAL_COMMUNITY)
Admission: RE | Admit: 2024-07-01 | Discharge: 2024-07-01 | Payer: Self-pay | Attending: Nurse Practitioner | Admitting: Nurse Practitioner

## 2024-07-01 ENCOUNTER — Ambulatory Visit (HOSPITAL_COMMUNITY)
Admission: RE | Admit: 2024-07-01 | Discharge: 2024-07-01 | Disposition: A | Payer: PRIVATE HEALTH INSURANCE | Source: Ambulatory Visit | Attending: Nurse Practitioner | Admitting: Nurse Practitioner

## 2024-07-01 ENCOUNTER — Encounter (HOSPITAL_COMMUNITY): Payer: Self-pay

## 2024-07-01 DIAGNOSIS — N6315 Unspecified lump in the right breast, overlapping quadrants: Secondary | ICD-10-CM | POA: Diagnosis present

## 2024-07-01 DIAGNOSIS — Z803 Family history of malignant neoplasm of breast: Secondary | ICD-10-CM | POA: Diagnosis present

## 2024-07-01 DIAGNOSIS — N63 Unspecified lump in unspecified breast: Secondary | ICD-10-CM

## 2024-07-01 DIAGNOSIS — Z0001 Encounter for general adult medical examination with abnormal findings: Secondary | ICD-10-CM | POA: Diagnosis present

## 2024-07-01 DIAGNOSIS — Z1231 Encounter for screening mammogram for malignant neoplasm of breast: Secondary | ICD-10-CM | POA: Diagnosis present

## 2024-07-12 ENCOUNTER — Other Ambulatory Visit: Payer: Self-pay | Admitting: Nurse Practitioner

## 2024-07-12 DIAGNOSIS — F331 Major depressive disorder, recurrent, moderate: Secondary | ICD-10-CM

## 2024-07-12 DIAGNOSIS — Z0001 Encounter for general adult medical examination with abnormal findings: Secondary | ICD-10-CM

## 2024-07-22 ENCOUNTER — Ambulatory Visit: Payer: Self-pay | Admitting: Nurse Practitioner

## 2024-07-23 ENCOUNTER — Encounter: Payer: Self-pay | Admitting: Nurse Practitioner

## 2024-09-05 ENCOUNTER — Encounter: Payer: Self-pay | Admitting: Adult Health
